# Patient Record
Sex: Female | Born: 1965 | Race: White | Hispanic: No | Marital: Married | Smoking: Never smoker
Health system: Southern US, Community
[De-identification: ages and names within clinical notes are randomized; demographics above are authoritative.]

## PROBLEM LIST (undated history)

## (undated) DIAGNOSIS — K219 Gastro-esophageal reflux disease without esophagitis: Secondary | ICD-10-CM

## (undated) DIAGNOSIS — F419 Anxiety disorder, unspecified: Secondary | ICD-10-CM

## (undated) DIAGNOSIS — R7303 Prediabetes: Secondary | ICD-10-CM

## (undated) DIAGNOSIS — C4491 Basal cell carcinoma of skin, unspecified: Secondary | ICD-10-CM

## (undated) DIAGNOSIS — N870 Mild cervical dysplasia: Secondary | ICD-10-CM

## (undated) HISTORY — DX: Mild cervical dysplasia: N87.0

## (undated) HISTORY — PX: COLONOSCOPY: SHX174

## (undated) HISTORY — PX: CERVICAL BIOPSY  W/ LOOP ELECTRODE EXCISION: SUR135

## (undated) HISTORY — DX: Anxiety disorder, unspecified: F41.9

## (undated) HISTORY — DX: Basal cell carcinoma of skin, unspecified: C44.91

---

## 1995-01-30 HISTORY — PX: DILATION AND CURETTAGE OF UTERUS: SHX78

## 1997-09-07 ENCOUNTER — Other Ambulatory Visit: Admission: RE | Admit: 1997-09-07 | Discharge: 1997-09-07 | Payer: Self-pay | Admitting: Obstetrics and Gynecology

## 1998-09-30 ENCOUNTER — Other Ambulatory Visit: Admission: RE | Admit: 1998-09-30 | Discharge: 1998-09-30 | Payer: Self-pay | Admitting: Obstetrics and Gynecology

## 1999-10-17 ENCOUNTER — Other Ambulatory Visit: Admission: RE | Admit: 1999-10-17 | Discharge: 1999-10-17 | Payer: Self-pay | Admitting: Gynecology

## 2000-02-14 ENCOUNTER — Other Ambulatory Visit: Admission: RE | Admit: 2000-02-14 | Discharge: 2000-02-14 | Payer: Self-pay | Admitting: Gynecology

## 2001-08-22 ENCOUNTER — Other Ambulatory Visit: Admission: RE | Admit: 2001-08-22 | Discharge: 2001-08-22 | Payer: Self-pay | Admitting: Gynecology

## 2001-08-27 ENCOUNTER — Encounter: Admission: RE | Admit: 2001-08-27 | Discharge: 2001-11-25 | Payer: Self-pay | Admitting: Gynecology

## 2002-03-09 ENCOUNTER — Other Ambulatory Visit: Admission: RE | Admit: 2002-03-09 | Discharge: 2002-03-09 | Payer: Self-pay | Admitting: Gynecology

## 2003-11-08 ENCOUNTER — Other Ambulatory Visit: Admission: RE | Admit: 2003-11-08 | Discharge: 2003-11-08 | Payer: Self-pay | Admitting: Gynecology

## 2004-12-26 ENCOUNTER — Other Ambulatory Visit: Admission: RE | Admit: 2004-12-26 | Discharge: 2004-12-26 | Payer: Self-pay | Admitting: Gynecology

## 2005-11-05 ENCOUNTER — Encounter: Admission: RE | Admit: 2005-11-05 | Discharge: 2005-11-05 | Payer: Self-pay | Admitting: Gynecology

## 2005-11-15 ENCOUNTER — Encounter: Admission: RE | Admit: 2005-11-15 | Discharge: 2005-11-15 | Payer: Self-pay | Admitting: Gynecology

## 2006-02-07 ENCOUNTER — Other Ambulatory Visit: Admission: RE | Admit: 2006-02-07 | Discharge: 2006-02-07 | Payer: Self-pay | Admitting: Gynecology

## 2006-11-08 ENCOUNTER — Encounter: Admission: RE | Admit: 2006-11-08 | Discharge: 2006-11-08 | Payer: Self-pay | Admitting: Gynecology

## 2006-11-14 ENCOUNTER — Encounter: Admission: RE | Admit: 2006-11-14 | Discharge: 2006-11-14 | Payer: Self-pay | Admitting: Gynecology

## 2007-03-03 ENCOUNTER — Other Ambulatory Visit: Admission: RE | Admit: 2007-03-03 | Discharge: 2007-03-03 | Payer: Self-pay | Admitting: Gynecology

## 2007-11-21 ENCOUNTER — Encounter: Admission: RE | Admit: 2007-11-21 | Discharge: 2007-11-21 | Payer: Self-pay | Admitting: Gynecology

## 2008-03-08 ENCOUNTER — Other Ambulatory Visit: Admission: RE | Admit: 2008-03-08 | Discharge: 2008-03-08 | Payer: Self-pay | Admitting: Gynecology

## 2008-03-08 ENCOUNTER — Ambulatory Visit: Payer: Self-pay | Admitting: Gynecology

## 2008-03-08 ENCOUNTER — Encounter: Payer: Self-pay | Admitting: Gynecology

## 2008-09-07 ENCOUNTER — Ambulatory Visit: Payer: Self-pay | Admitting: Gynecology

## 2008-11-22 ENCOUNTER — Encounter: Admission: RE | Admit: 2008-11-22 | Discharge: 2008-11-22 | Payer: Self-pay | Admitting: Gynecology

## 2009-04-04 ENCOUNTER — Ambulatory Visit: Payer: Self-pay | Admitting: Gynecology

## 2009-04-04 ENCOUNTER — Other Ambulatory Visit: Admission: RE | Admit: 2009-04-04 | Discharge: 2009-04-04 | Payer: Self-pay | Admitting: Gynecology

## 2009-11-23 ENCOUNTER — Encounter: Admission: RE | Admit: 2009-11-23 | Discharge: 2009-11-23 | Payer: Self-pay | Admitting: Gynecology

## 2010-02-18 ENCOUNTER — Encounter: Payer: Self-pay | Admitting: Gynecology

## 2010-04-11 ENCOUNTER — Encounter (INDEPENDENT_AMBULATORY_CARE_PROVIDER_SITE_OTHER): Payer: 59 | Admitting: Gynecology

## 2010-04-11 ENCOUNTER — Other Ambulatory Visit: Payer: Self-pay | Admitting: Gynecology

## 2010-04-11 ENCOUNTER — Other Ambulatory Visit (HOSPITAL_COMMUNITY)
Admission: RE | Admit: 2010-04-11 | Discharge: 2010-04-11 | Disposition: A | Discharge status: Home / Self Care | Payer: 59 | Source: Ambulatory Visit | Attending: Gynecology | Admitting: Gynecology

## 2010-04-11 DIAGNOSIS — Z124 Encounter for screening for malignant neoplasm of cervix: Secondary | ICD-10-CM | POA: Insufficient documentation

## 2010-04-11 DIAGNOSIS — Z833 Family history of diabetes mellitus: Secondary | ICD-10-CM

## 2010-04-11 DIAGNOSIS — Z01419 Encounter for gynecological examination (general) (routine) without abnormal findings: Secondary | ICD-10-CM

## 2010-04-11 DIAGNOSIS — R635 Abnormal weight gain: Secondary | ICD-10-CM

## 2010-04-11 DIAGNOSIS — Z13220 Encounter for screening for lipoid disorders: Secondary | ICD-10-CM

## 2010-04-11 DIAGNOSIS — N6311 Unspecified lump in the right breast, upper outer quadrant: Secondary | ICD-10-CM

## 2010-04-17 ENCOUNTER — Ambulatory Visit
Admission: RE | Admit: 2010-04-17 | Discharge: 2010-04-17 | Disposition: A | Discharge status: Home / Self Care | Payer: 59 | Source: Ambulatory Visit | Attending: Gynecology | Admitting: Gynecology

## 2010-04-17 DIAGNOSIS — N6311 Unspecified lump in the right breast, upper outer quadrant: Secondary | ICD-10-CM

## 2010-07-04 ENCOUNTER — Ambulatory Visit (INDEPENDENT_AMBULATORY_CARE_PROVIDER_SITE_OTHER): Payer: 59 | Admitting: Gynecology

## 2010-07-04 DIAGNOSIS — N898 Other specified noninflammatory disorders of vagina: Secondary | ICD-10-CM

## 2010-07-04 DIAGNOSIS — L293 Anogenital pruritus, unspecified: Secondary | ICD-10-CM

## 2010-07-04 DIAGNOSIS — R82998 Other abnormal findings in urine: Secondary | ICD-10-CM

## 2010-07-04 DIAGNOSIS — R350 Frequency of micturition: Secondary | ICD-10-CM

## 2010-07-04 DIAGNOSIS — B373 Candidiasis of vulva and vagina: Secondary | ICD-10-CM

## 2010-10-09 ENCOUNTER — Other Ambulatory Visit: Payer: Self-pay | Admitting: Gynecology

## 2010-10-09 DIAGNOSIS — Z1231 Encounter for screening mammogram for malignant neoplasm of breast: Secondary | ICD-10-CM

## 2010-10-12 ENCOUNTER — Encounter: Payer: Self-pay | Admitting: Gynecology

## 2010-10-12 ENCOUNTER — Ambulatory Visit (INDEPENDENT_AMBULATORY_CARE_PROVIDER_SITE_OTHER): Payer: 59 | Admitting: Gynecology

## 2010-10-12 VITALS — BP 108/68

## 2010-10-12 DIAGNOSIS — O009 Unspecified ectopic pregnancy without intrauterine pregnancy: Secondary | ICD-10-CM | POA: Insufficient documentation

## 2010-10-12 DIAGNOSIS — Z23 Encounter for immunization: Secondary | ICD-10-CM

## 2010-10-12 DIAGNOSIS — R82998 Other abnormal findings in urine: Secondary | ICD-10-CM

## 2010-10-12 DIAGNOSIS — R300 Dysuria: Secondary | ICD-10-CM

## 2010-10-12 DIAGNOSIS — N870 Mild cervical dysplasia: Secondary | ICD-10-CM | POA: Insufficient documentation

## 2010-10-12 DIAGNOSIS — B373 Candidiasis of vulva and vagina: Secondary | ICD-10-CM

## 2010-10-12 DIAGNOSIS — Z9852 Vasectomy status: Secondary | ICD-10-CM | POA: Insufficient documentation

## 2010-10-12 DIAGNOSIS — N390 Urinary tract infection, site not specified: Secondary | ICD-10-CM

## 2010-10-12 MED ORDER — FLUCONAZOLE 150 MG PO TABS: 150.0000 mg | ORAL_TABLET | Freq: Once | ORAL | Status: AC

## 2010-10-12 MED ORDER — NITROFURANTOIN MONOHYD MACRO 100 MG PO CAPS: 100.0000 mg | ORAL_CAPSULE | Freq: Two times a day (BID) | ORAL | Status: AC

## 2010-10-12 NOTE — Progress Notes (Signed)
Patient presented to the office today complaining of 2-3 day history of dysuria and frequency. She denies any back pain, nausea, vomiting, chills or vaginal discharge. Patient is separated has not been sexually active. Her last menstrual period was 10/05/2010. Review of her record and indicated in June she was treated for urinary tract infection with Cipro. Patient has seen Dr. Wilson Singer urologist several years ago for similar complaint. Patient hasn't had a urinary tract infection before the or the graft recommended she followup with him.  Examination: Back: No CVA tenderness Abdomen: Soft nontender no rebound or guarding Pelvic: Bartholin urethra Skene was within normal limits Vagina: No gross lesions on inspection Cervix: No lesions or discharge Bimanual examination: Uterus anteverted normal size shape and consistency adnexa no masses or tenderness Rectal: Not done  Urinalysis demonstrated 2+ bacteria with 5-10 WBC and 2-3 RBC.  Assessment/plan based on patient's symptomatology and urinalysis we'll proceed with treating her with Macrobid 100 mg one tablet twice a day for 7 days have given her prescription also for Uribel as an anti-spasmodic agent to take 1 tablet 4 times a day for 2 days and to increase her fluid intake. Because of her sensitivity to antibiotics she will be given a prescription of Diflucan 150 mg to take one by mouth in the event of an yeast infection.Flu vaccine administered today after consent form signed.

## 2010-10-26 ENCOUNTER — Other Ambulatory Visit: Payer: Self-pay | Admitting: Dermatology

## 2010-11-27 ENCOUNTER — Ambulatory Visit
Admission: RE | Admit: 2010-11-27 | Discharge: 2010-11-27 | Disposition: A | Discharge status: Home / Self Care | Payer: 59 | Source: Ambulatory Visit | Attending: Gynecology | Admitting: Gynecology

## 2010-11-27 DIAGNOSIS — Z1231 Encounter for screening mammogram for malignant neoplasm of breast: Secondary | ICD-10-CM

## 2010-12-11 ENCOUNTER — Other Ambulatory Visit: Payer: Self-pay | Admitting: *Deleted

## 2010-12-11 MED ORDER — ALPRAZOLAM 0.25 MG PO TABS: 0.2500 mg | ORAL_TABLET | Freq: Every evening | ORAL | Status: DC | PRN

## 2010-12-12 NOTE — Telephone Encounter (Signed)
Called in rx to pharmacy.

## 2011-01-25 ENCOUNTER — Encounter: Payer: Self-pay | Admitting: Women's Health

## 2011-01-25 ENCOUNTER — Ambulatory Visit (INDEPENDENT_AMBULATORY_CARE_PROVIDER_SITE_OTHER): Payer: 59 | Admitting: Women's Health

## 2011-01-25 DIAGNOSIS — R82998 Other abnormal findings in urine: Secondary | ICD-10-CM

## 2011-01-25 DIAGNOSIS — R109 Unspecified abdominal pain: Secondary | ICD-10-CM

## 2011-01-25 DIAGNOSIS — B373 Candidiasis of vulva and vagina: Secondary | ICD-10-CM

## 2011-01-25 MED ORDER — FLUCONAZOLE 150 MG PO TABS: 150.0000 mg | ORAL_TABLET | Freq: Once | ORAL | Status: AC

## 2011-01-25 NOTE — Progress Notes (Signed)
Patient ID: Amanda Zuniga, female   DOB: 1965/07/30, 45 y.o.   MRN: [id number] Presents with a complaint of low abdominal bladder pressure and increased urinary frequency. History of recurrent UTIs especially associated with intercourse. Has not been sexually active in greater than 6 months.Increase in bladder pressure and pressure in the lower left quadrant. Denies any changes in bowel elimination or vaginal discharge. Monthly cycle without change. Used Macrobid 2 months ago with good relief of symptoms. Significant history mother and maternal grandmother with bladder cancer.  Exam: Appears well. UA 0-2 WBCs, +1 bacteria, 0-2 RBCs. No CVAT abdomen was soft with no rebound or radiation of pain. External genitalia normal limits, speculum exam scant white discharge, wet prep positive for yeast. Bimanual no CMT slight discomfort in left lower quadrant, no noted fullness.  Plan: Culture urine. Diflucan 150 by mouth x1 dose with refill. Schedule ultrasound to rule out ovarian problem. Reviewed followup with urologist due to strong family history of bladder cancer.

## 2011-01-29 ENCOUNTER — Encounter: Payer: Self-pay | Admitting: *Deleted

## 2011-01-29 NOTE — Progress Notes (Signed)
Patient ID: Amanda Zuniga, female   DOB: [birth year], 45 y.o.   MRN: [id number] Pharmacy called wanting to make that pt xanax 0.25 mg is correct,when called in. Per epic [facility] approved medication for pt. Pt was on xanax 0.5 mg. Spoke with Revonda Standard pharmacist at [facility] regarding this.

## 2011-02-02 ENCOUNTER — Ambulatory Visit: Payer: 59 | Admitting: Women's Health

## 2011-02-02 ENCOUNTER — Other Ambulatory Visit: Payer: 59

## 2011-02-05 ENCOUNTER — Ambulatory Visit (INDEPENDENT_AMBULATORY_CARE_PROVIDER_SITE_OTHER): Payer: 59

## 2011-02-05 ENCOUNTER — Ambulatory Visit (INDEPENDENT_AMBULATORY_CARE_PROVIDER_SITE_OTHER): Payer: 59 | Admitting: Women's Health

## 2011-02-05 ENCOUNTER — Other Ambulatory Visit: Payer: Self-pay | Admitting: Women's Health

## 2011-02-05 DIAGNOSIS — R350 Frequency of micturition: Secondary | ICD-10-CM

## 2011-02-05 DIAGNOSIS — N8300 Follicular cyst of ovary, unspecified side: Secondary | ICD-10-CM

## 2011-02-05 DIAGNOSIS — R109 Unspecified abdominal pain: Secondary | ICD-10-CM

## 2011-02-05 DIAGNOSIS — N949 Unspecified condition associated with female genital organs and menstrual cycle: Secondary | ICD-10-CM

## 2011-02-05 DIAGNOSIS — N800 Endometriosis of uterus: Secondary | ICD-10-CM

## 2011-02-05 LAB — URINALYSIS, ROUTINE W REFLEX MICROSCOPIC
Leukocytes, UA: NEGATIVE
Nitrite: NEGATIVE
Specific Gravity, Urine: 1.015 (ref 1.005–1.030)
Urobilinogen, UA: 0.2 mg/dL (ref 0.0–1.0)
pH: 5.5 (ref 5.0–8.0)

## 2011-02-05 LAB — URINALYSIS, MICROSCOPIC ONLY: Casts: NONE SEEN

## 2011-02-05 MED ORDER — NITROFURANTOIN MONOHYD MACRO 100 MG PO CAPS: 100.0000 mg | ORAL_CAPSULE | Freq: Two times a day (BID) | ORAL | Status: DC

## 2011-02-05 NOTE — Patient Instructions (Signed)
Adenomyosis/cortical cystic areas in endometrium

## 2011-02-05 NOTE — Progress Notes (Signed)
Patient ID: Amanda Zuniga, female   DOB: [birth year], 46 y.o.   MRN: [id number] Presents for a followup ultrasound from office visit on 01/25/11.Complaint of bladder pressure, frequency, and urgency with a negative UA and culture. Continues with a bladder pressure, dull ache, denies pain, or burning. Denies a fever.  Ultrasound: Anteverted uterus a tiny cortical cystic areas on the wall the endometrium. Normal uterus and ovaries. Follicle on the right ovary 19 x 17 mm left ovary a follicle 13 mm negative cul-de-sac.  Bladder pressure/adenomyosis  Plan: Recheck urine-trace hemaglobin.  Macrobid 100 prophylactically with intercourse, followup with urologist due to strong family history of bladder cancer/mother and grandmother and trace blood in urine.

## 2011-04-20 ENCOUNTER — Ambulatory Visit (INDEPENDENT_AMBULATORY_CARE_PROVIDER_SITE_OTHER): Payer: 59 | Admitting: Gynecology

## 2011-04-20 ENCOUNTER — Encounter: Payer: Self-pay | Admitting: Gynecology

## 2011-04-20 ENCOUNTER — Other Ambulatory Visit (HOSPITAL_COMMUNITY)
Admission: RE | Admit: 2011-04-20 | Discharge: 2011-04-20 | Disposition: A | Discharge status: Home / Self Care | Payer: 59 | Source: Ambulatory Visit | Attending: Gynecology | Admitting: Gynecology

## 2011-04-20 VITALS — BP 110/70 | Ht 65.25 in | Wt 137.0 lb

## 2011-04-20 DIAGNOSIS — Z01419 Encounter for gynecological examination (general) (routine) without abnormal findings: Secondary | ICD-10-CM

## 2011-04-20 DIAGNOSIS — O99810 Abnormal glucose complicating pregnancy: Secondary | ICD-10-CM

## 2011-04-20 DIAGNOSIS — O24419 Gestational diabetes mellitus in pregnancy, unspecified control: Secondary | ICD-10-CM

## 2011-04-20 MED ORDER — ALPRAZOLAM 0.25 MG PO TABS: 0.2500 mg | ORAL_TABLET | Freq: Every evening | ORAL | Status: DC | PRN

## 2011-04-20 MED ORDER — NITROFURANTOIN MONOHYD MACRO 100 MG PO CAPS: 100.0000 mg | ORAL_CAPSULE | Freq: Two times a day (BID) | ORAL | Status: DC

## 2011-04-20 MED ORDER — CLINDAMYCIN PHOSPHATE 2 % VA CREA: 1.0000 | TOPICAL_CREAM | Freq: Every day | VAGINAL | Status: AC

## 2011-04-20 NOTE — Progress Notes (Signed)
Amanda Zuniga Jan 16, 1966 [id number]   History:    46 y.o.  for annual exam gravida 4 para 2 AB 1 with known history of gestational diabetes. Patient's husband has had a vasectomy. Patient's having normal menstrual cycles. BMI 22.6 same as last year. Last mammogram October 2012 normal. Patient frequently does self breast exam. Last normal Pap smear March 2012. History of CIN-1 in 2006. Patient with history of basal cell carcinoma of the back, chest and right leg has been followed by Dr. Amy Swaziland (dermatologist) every 6 months.  Past medical history,surgical history, family history and social history were all reviewed and documented in the EPIC chart.  Gynecologic History Patient's last menstrual period was 04/04/2011. Contraception: vasectomy Last Pap: 2012. Results were: normal Last mammogram: 2012. Results were: normal  Obstetric History OB History    Grav Para Term Preterm Abortions TAB SAB Ect Mult Living   4 2 1 1 2  1 1  2      # Outc Date GA Lbr Len/2nd Wgt Sex Del Anes PTL Lv   1 TRM     M SVD  No Yes   2 PRE     M CS  Yes Yes   3 SAB            4 ECT                ROS:  Was performed and pertinent positives and negatives are included in the history.  Exam: chaperone present  BP 110/70  Ht 5' 5.25" (1.657 m)  Wt 137 lb (62.143 kg)  BMI 22.62 kg/m2  LMP 04/04/2011  Body mass index is 22.62 kg/(m^2).  General appearance : Well developed well nourished female. No acute distress HEENT: Neck supple, trachea midline, no carotid bruits, no thyroidmegaly Lungs: Clear to auscultation, no rhonchi or wheezes, or rib retractions  Heart: Regular rate and rhythm, no murmurs or gallops Breast:Examined in sitting and supine position were symmetrical in appearance, no palpable masses or tenderness,  no skin retraction, no nipple inversion, no nipple discharge, no skin discoloration, no axillary or supraclavicular lymphadenopathy Abdomen: no palpable masses or tenderness, no  rebound or guarding Extremities: no edema or skin discoloration or tenderness  Pelvic:  Bartholin, Urethra, Skene Glands: Within normal limits             Vagina: No gross lesions or discharge, right vaginal fornix leukoplakic areas?  Cervix: No gross lesions or discharge  Uterus  anteverted, normal size, shape and consistency, non-tender and mobile  Adnexa  Without masses or tenderness  Anus and perineum  normal   Rectovaginal  normal sphincter tone without palpated masses or tenderness             Hemoccult not done     Assessment/Plan:  46 y.o. female for annual exam will be asked to return back next week for colposcopy do to right fornix leukoplakic areas. Hyperkeratosis? Pap smear was done today as well as a random blood sugar, cholesterol, CBC, and urinalysis. Mammogram later this year.    Ok Edwards MD, 10:47 AM 04/20/2011

## 2011-04-20 NOTE — Patient Instructions (Signed)
Will see you next week for colposcopy. Use the vaginal cream for 5 nights starting tonight

## 2011-04-21 LAB — URINALYSIS W MICROSCOPIC + REFLEX CULTURE
Bilirubin Urine: NEGATIVE
Glucose, UA: NEGATIVE mg/dL
Protein, ur: NEGATIVE mg/dL
Urobilinogen, UA: 0.2 mg/dL (ref 0.0–1.0)

## 2011-04-21 LAB — CBC WITH DIFFERENTIAL/PLATELET
Basophils Absolute: 0 10*3/uL (ref 0.0–0.1)
Basophils Relative: 0 % (ref 0–1)
Eosinophils Absolute: 0.1 10*3/uL (ref 0.0–0.7)
Hemoglobin: 13.3 g/dL (ref 12.0–15.0)
MCH: 30 pg (ref 26.0–34.0)
MCHC: 31.7 g/dL (ref 30.0–36.0)
Monocytes Absolute: 0.6 10*3/uL (ref 0.1–1.0)
Monocytes Relative: 9 % (ref 3–12)
Neutro Abs: 4.1 10*3/uL (ref 1.7–7.7)
Neutrophils Relative %: 64 % (ref 43–77)
RDW: 12.8 % (ref 11.5–15.5)

## 2011-04-21 LAB — TSH: TSH: 1.667 u[IU]/mL (ref 0.350–4.500)

## 2011-04-21 LAB — GLUCOSE, RANDOM: Glucose, Bld: 78 mg/dL (ref 70–99)

## 2011-04-25 ENCOUNTER — Encounter: Payer: Self-pay | Admitting: Gynecology

## 2011-04-25 ENCOUNTER — Ambulatory Visit (INDEPENDENT_AMBULATORY_CARE_PROVIDER_SITE_OTHER): Payer: 59 | Admitting: Gynecology

## 2011-04-25 VITALS — BP 100/66

## 2011-04-25 DIAGNOSIS — N898 Other specified noninflammatory disorders of vagina: Secondary | ICD-10-CM

## 2011-04-25 NOTE — Progress Notes (Signed)
patient presented to the office for colposcopic evaluation of her cervix and vagina as a result of an incidental finding during her annual gynecological examination on March 22 whereby a leukoplakic-like lesions were noted around the vaginal fornix (right). Patient with past history of CIN-1 in 2006 with normal Pap smear subsequently. Most recent Pap smear March 22 was normal of this year.  Patient underwent a detail colposcopic evaluation the external genitalia perineal body and perirectal region were thoroughly inspected no lesions were noted. The speculum was introduced into the vagina and a systematic inspection of the vagina right and left anterior and posterior fornices were evaluated with no lesions seen. Endocervical speculum was utilized transformation zone was visualized and no lesions noted. Of note acetic acid applied and no lesions were seen.  The area seen in the right fornix at time of her annual exam with shortly after her last menstrual period and perhaps attributed irritation from tampon usage. Patient was reassured she'll return back in one year for her annual exam or when necessary.

## 2011-10-04 ENCOUNTER — Other Ambulatory Visit: Payer: Self-pay | Admitting: *Deleted

## 2011-10-04 MED ORDER — ALPRAZOLAM 0.25 MG PO TABS: 0.2500 mg | ORAL_TABLET | Freq: Every evening | ORAL | Status: DC | PRN

## 2011-10-12 ENCOUNTER — Other Ambulatory Visit: Payer: Self-pay | Admitting: Dermatology

## 2011-10-25 ENCOUNTER — Other Ambulatory Visit: Payer: Self-pay | Admitting: Gynecology

## 2011-10-25 DIAGNOSIS — Z1231 Encounter for screening mammogram for malignant neoplasm of breast: Secondary | ICD-10-CM

## 2011-11-30 ENCOUNTER — Ambulatory Visit
Admission: RE | Admit: 2011-11-30 | Discharge: 2011-11-30 | Disposition: A | Discharge status: Home / Self Care | Payer: 59 | Source: Ambulatory Visit | Attending: Gynecology | Admitting: Gynecology

## 2011-11-30 DIAGNOSIS — Z1231 Encounter for screening mammogram for malignant neoplasm of breast: Secondary | ICD-10-CM

## 2012-01-31 ENCOUNTER — Other Ambulatory Visit: Payer: Self-pay | Admitting: Women's Health

## 2012-01-31 NOTE — Telephone Encounter (Signed)
Dr. Glenetta Hew prescribed this at 04/20/2011 [facility] but for #14 with refills.  The request came in under your name and for #30.

## 2012-01-31 NOTE — Telephone Encounter (Signed)
Telephone call to clarify request. States takes only as needed  postcoital no symptoms currently out of Rx.

## 2012-03-14 ENCOUNTER — Other Ambulatory Visit: Payer: Self-pay | Admitting: Gynecology

## 2012-04-11 ENCOUNTER — Other Ambulatory Visit: Payer: Self-pay | Admitting: Dermatology

## 2012-04-21 ENCOUNTER — Ambulatory Visit (INDEPENDENT_AMBULATORY_CARE_PROVIDER_SITE_OTHER): Payer: 59 | Admitting: Gynecology

## 2012-04-21 ENCOUNTER — Encounter: Payer: Self-pay | Admitting: Gynecology

## 2012-04-21 VITALS — BP 112/68 | Ht 65.0 in | Wt 142.5 lb

## 2012-04-21 DIAGNOSIS — Z8632 Personal history of gestational diabetes: Secondary | ICD-10-CM

## 2012-04-21 DIAGNOSIS — Z01419 Encounter for gynecological examination (general) (routine) without abnormal findings: Secondary | ICD-10-CM

## 2012-04-21 DIAGNOSIS — Z23 Encounter for immunization: Secondary | ICD-10-CM

## 2012-04-21 LAB — CBC WITH DIFFERENTIAL/PLATELET
Basophils Absolute: 0 10*3/uL (ref 0.0–0.1)
Eosinophils Relative: 0 % (ref 0–5)
HCT: 39.9 % (ref 36.0–46.0)
Hemoglobin: 13.4 g/dL (ref 12.0–15.0)
Lymphocytes Relative: 25 % (ref 12–46)
Lymphs Abs: 2.2 10*3/uL (ref 0.7–4.0)
MCV: 88.1 fL (ref 78.0–100.0)
Monocytes Absolute: 0.4 10*3/uL (ref 0.1–1.0)
Monocytes Relative: 5 % (ref 3–12)
Neutro Abs: 6.2 10*3/uL (ref 1.7–7.7)
RDW: 12.7 % (ref 11.5–15.5)
WBC: 8.9 10*3/uL (ref 4.0–10.5)

## 2012-04-21 LAB — LIPID PANEL
Cholesterol: 203 mg/dL — ABNORMAL HIGH (ref 0–200)
HDL: 62 mg/dL (ref >39)
Total CHOL/HDL Ratio: 3.3 Ratio

## 2012-04-21 LAB — GLUCOSE, RANDOM: Glucose, Bld: 92 mg/dL (ref 70–99)

## 2012-04-21 MED ORDER — ALPRAZOLAM 0.25 MG PO TABS: 0.2500 mg | ORAL_TABLET | Freq: Every evening | ORAL | Status: DC | PRN

## 2012-04-21 NOTE — Patient Instructions (Addendum)

## 2012-04-21 NOTE — Progress Notes (Signed)
Amanda Zuniga 07/30/1965 [id number]   History:    47 y.o.  for annual gyn exam with no complaints today. She doesn't take Xanax on a when necessary basis for anxiety. Her menstrual cycles are reported to be normal. Her husband has had a vasectomy. Patient is exercising regularly. Her BMI today was 23.71.  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 Followup Pap smears have been normal.  Patient does have history of basal cell carcinoma of her back, chest wall, and right legAnd is being followed by her dermatologist Dr. Amy Swaziland twice a year. Past medical history,surgical history, family history and social history were all reviewed and documented in the EPIC chart.  Gynecologic History Patient's last menstrual period was 03/24/2012. Contraception: vasectomy Last Pap: 2013. Results were: normal Last mammogram: November 2013. Results were: normal  Obstetric History OB History   Grav Para Term Preterm Abortions TAB SAB Ect Mult Living   4 2 1 1 2  1 1  2      # Outc Date GA Lbr Len/2nd Wgt Sex Del Anes PTL Lv   1 TRM     M SVD  No Yes   2 PRE     M CS  Yes Yes   3 SAB            4 ECT                ROS: A ROS was performed and pertinent positives and negatives are included in the history.  GENERAL: No fevers or chills. HEENT: No change in vision, no earache, sore throat or sinus congestion. NECK: No pain or stiffness. CARDIOVASCULAR: No chest pain or pressure. No palpitations. PULMONARY: No shortness of breath, cough or wheeze. GASTROINTESTINAL: No abdominal pain, nausea, vomiting or diarrhea, melena or bright red blood per rectum. GENITOURINARY: No urinary frequency, urgency, hesitancy or dysuria. MUSCULOSKELETAL: No joint or muscle pain, no back pain, no recent trauma. DERMATOLOGIC: No rash, no itching, no lesions. ENDOCRINE: No polyuria, polydipsia, no heat or cold intolerance. No recent change in weight. HEMATOLOGICAL: No anemia  or easy bruising or bleeding. NEUROLOGIC: No headache, seizures, numbness, tingling or weakness. PSYCHIATRIC: No depression, no loss of interest in normal activity or change in sleep pattern.     Exam: chaperone present  BP 112/68  Ht 5\' 5"  (1.651 m)  Wt 142 lb 8 oz (64.638 kg)  BMI 23.71 kg/m2  LMP 03/24/2012  Body mass index is 23.71 kg/(m^2).  General appearance : Well developed well nourished female. No acute distress HEENT: Neck supple, trachea midline, no carotid bruits, no thyroidmegaly Lungs: Clear to auscultation, no rhonchi or wheezes, or rib retractions  Heart: Regular rate and rhythm, no murmurs or gallops Breast:Examined in sitting and supine position were symmetrical in appearance, no palpable masses or tenderness,  no skin retraction, no nipple inversion, no nipple discharge, no skin discoloration, no axillary or supraclavicular lymphadenopathy Abdomen: no palpable masses or tenderness, no rebound or guarding Extremities: no edema or skin discoloration or tenderness  Pelvic:  Bartholin, Urethra, Skene Glands: Within normal limits             Vagina: No gross lesions or discharge  Cervix: No gross lesions or discharge  Uterus  anteverted, normal size, shape and consistency, non-tender and mobile  Adnexa  Without masses or tenderness  Anus and perineum  normal   Rectovaginal  normal sphincter tone without palpated masses or tenderness  Hemoccult not indicated     Assessment/Plan:  47 y.o. female for annual exam and we'll have the following labs drawn today: Because of her history in the past of gestational diabetes we will do a fasting blood sugar today along with her fasting lipid profile, TSH, CBC and urinalysis. No Pap smear done today the new guidelines were discussed. She was reminded that on the importance of self breast examination as well as continue her exercise program and her calcium and vitamin D intake. Prescription refill for Xanax for anxiety  was provided. She received a Tdap vaccine and literature information was provided.    Ok Edwards MD, 10:50 AM 04/21/2012

## 2012-04-21 NOTE — Addendum Note (Signed)
Addended by: Bertram Savin A on: 04/21/2012 10:59 AM   Modules accepted: Orders

## 2012-04-21 NOTE — Addendum Note (Signed)
Addended by: Ok Edwards on: 04/21/2012 11:50 AM   Modules accepted: Orders

## 2012-04-22 ENCOUNTER — Telehealth: Payer: Self-pay | Admitting: *Deleted

## 2012-04-22 LAB — URINALYSIS W MICROSCOPIC + REFLEX CULTURE
Bilirubin Urine: NEGATIVE
Casts: NONE SEEN
Glucose, UA: NEGATIVE mg/dL
Hgb urine dipstick: NEGATIVE
Leukocytes, UA: NEGATIVE
Protein, ur: NEGATIVE mg/dL
Squamous Epithelial / LPF: NONE SEEN
pH: 6 (ref 5.0–8.0)

## 2012-04-22 NOTE — Telephone Encounter (Signed)
error 

## 2012-04-22 NOTE — Telephone Encounter (Signed)
Pt called stating her xanax 0.25 mg tablets were not at her pharmacy from [facility] yesterday due to set on phone in. x[facility]ax 0.25 [facility] informed.

## 2012-05-02 ENCOUNTER — Encounter: Payer: Self-pay | Admitting: Gynecology

## 2012-08-21 ENCOUNTER — Other Ambulatory Visit: Payer: Self-pay | Admitting: Gynecology

## 2012-08-22 NOTE — Telephone Encounter (Signed)
Called into pharmacy

## 2012-09-09 ENCOUNTER — Telehealth: Payer: Self-pay | Admitting: *Deleted

## 2012-09-09 ENCOUNTER — Ambulatory Visit: Payer: 59 | Admitting: Gynecology

## 2012-09-09 ENCOUNTER — Encounter: Payer: Self-pay | Admitting: Gynecology

## 2012-09-09 ENCOUNTER — Ambulatory Visit (INDEPENDENT_AMBULATORY_CARE_PROVIDER_SITE_OTHER): Payer: 59 | Admitting: Gynecology

## 2012-09-09 VITALS — BP 114/70

## 2012-09-09 DIAGNOSIS — N951 Menopausal and female climacteric states: Secondary | ICD-10-CM | POA: Insufficient documentation

## 2012-09-09 DIAGNOSIS — N949 Unspecified condition associated with female genital organs and menstrual cycle: Secondary | ICD-10-CM

## 2012-09-09 DIAGNOSIS — N938 Other specified abnormal uterine and vaginal bleeding: Secondary | ICD-10-CM | POA: Insufficient documentation

## 2012-09-09 LAB — CBC WITH DIFFERENTIAL/PLATELET
Basophils Relative: 0 % (ref 0–1)
Eosinophils Absolute: 0.1 10*3/uL (ref 0.0–0.7)
Eosinophils Relative: 1 % (ref 0–5)
Hemoglobin: 13.8 g/dL (ref 12.0–15.0)
Lymphs Abs: 2.1 10*3/uL (ref 0.7–4.0)
MCH: 30 pg (ref 26.0–34.0)
MCHC: 33.7 g/dL (ref 30.0–36.0)
MCV: 89.1 fL (ref 78.0–100.0)
Monocytes Relative: 8 % (ref 3–12)
RBC: 4.6 MIL/uL (ref 3.87–5.11)

## 2012-09-09 NOTE — Progress Notes (Signed)
Patient is a 47 year old who presented to the office today because of dysfunctional uterine bleeding and vasomotor like symptoms. Her husband has had a vasectomy. Patient states that for the past 6 months she's had 2 periods per month lasting 5-7 days. She has episodes of hot flashes irritability at times mood swings but no true vaginal dryness or node decreased libido. She stated her mother reached menopause at the age of 42. Patient denied any nipple discharge, unusual headaches or visual disturbances.  Exam: Abdomen: Soft nontender no rebound guarding pelvic: And urethra Skene was within normal limits Vagina: No lesions or discharge Cervix: No lesions or discharge Uterus: Anteverted normal size shape and consistency Adnexa: No palpable mass or tenderness Rectal exam not done  Assessment/plan: A 47 year old patient with peri-menopausal-like symptoms and dysfunctional uterine bleeding. Her cervix was cleansed with Betadine solution and a Pipelle was introduced into the intrauterine cavity whereby an endometrial biopsy was obtained and submitted for histological evaluation. Patient will be scheduled for a sonohysterogram to rule out any intracavitary polyps or myomas contributing to her irregular bleeding. Following labs were ordered: CBC, FSH, TSH and prolactin. Patient had a normal Pap smear March 2013.

## 2012-09-09 NOTE — Telephone Encounter (Signed)
Pt had endometrial biopsy done today called back stating he has some bleeding asking if normal and if okay to were tampon? I left message on her voicemail okay to wear tampon and yes normal to have bleeding after bx.

## 2012-09-09 NOTE — Patient Instructions (Addendum)
Endometrial Biopsy This is a test in which a tissue sample (a biopsy) is taken from inside the uterus (womb). It is then looked at by a specialist under a microscope to see if the tissue is normal or abnormal. The endometrium is the lining of the uterus. This test helps determine where you are in your menstrual cycle and how hormone levels are affecting the lining of the uterus. Another use for this test is to diagnose endometrial cancer, tuberculosis, polyps, or inflammatory conditions and to evaluate uterine bleeding. PREPARATION FOR TEST No preparation or fasting is necessary. NORMAL FINDINGS No pathologic conditions. Presence of "secretory-type" endometrium 3 to 5 days before to normal menstruation. Ranges for normal findings may vary among different laboratories and hospitals. You should always check with your doctor after having lab work or other tests done to discuss the meaning of your test results and whether your values are considered within normal limits. MEANING OF TEST  Your caregiver will go over the test results with you and discuss the importance and meaning of your results, as well as treatment options and the need for additional tests if necessary. OBTAINING THE TEST RESULTS It is your responsibility to obtain your test results. Ask the lab or department performing the test when and how you will get your results. Document Released: 05/18/2004 Document Revised: 04/09/2011 Document Reviewed: 12/26/2007 [facility] Patient Information 2014 [place], [place]. Transvaginal Ultrasound Transvaginal ultrasound is a pelvic ultrasound, using a metal probe that is placed in the vagina, to look at a women's female organs. Transvaginal ultrasound is a method of seeing inside the pelvis of a woman. The ultrasound machine sends out sound waves from the transducer (probe). These sound waves bounce off body structures (like an echo) to create a picture. The picture shows up on a monitor. It is called  transvaginal because the probe is inserted into the vagina. There should be very little discomfort from the vaginal probe. This test can also be used during pregnancy. Endovaginal ultrasound is another name for a transvaginal ultrasound. In a transabdominal ultrasound, the probe is placed on the outside of the belly. This method gives pictures that are lower quality than pictures from the transvaginal technique. Transvaginal ultrasound is used to look for problems of the female genital tract. Some such problems include:  Infertility problems.  Congenital (birth defect) malformations of the uterus and ovaries.  Tumors in the uterus.  Abnormal bleeding.  Ovarian tumors and cysts.  Abscess (inflamed tissue around pus) in the pelvis.  Unexplained abdominal or pelvic pain.  Pelvic infection. DURING PREGNANCY, TRANSVAGINAL ULTRASOUND MAY BE USED TO LOOK AT:  Normal pregnancy.  Ectopic pregnancy (pregnancy outside the uterus).  Fetal heartbeat.  Abnormalities in the pelvis, that are not seen well with transabdominal ultrasound.  Suspected twins or multiples.  Impending miscarriage.  Problems with the cervix (incompetent cervix, not able to stay closed and hold the baby).  When doing an amniocentesis (removing fluid from the pregnancy sac, for testing).  Looking for abnormalities of the baby.  Checking the growth, development, and age of the fetus.  Measuring the amount of fluid in the amniotic sac.  When doing an external version of the baby (moving baby into correct position).  Evaluating the baby for problems in high risk pregnancies (biophysical profile).  Suspected fetal demise (death). Sometimes a special ultrasound method called Saline Infusion Sonography (SIS) is used for a more accurate look at the uterus. Sterile saline (salt water) is injected into the uterus of non-pregnant  patients to see the inside of the uterus better. SIS is not used on pregnant women. The  vaginal probe can also assist in obtaining biopsies of abnormal areas, in draining fluid from cysts on the ovary, and in finding IUDs (intrauterine device, birth control) that cannot be located. PREPARATION FOR TEST A transvaginal ultrasound is done with the bladder empty. The transabdominal ultrasound is done with your bladder full. You may be asked to drink several glasses of water before that exam. Sometimes, a transabdominal ultrasound is done just after a transvaginal ultrasound, to look at organs in your abdomen. PROCEDURE  You will lie down on a table, with your knees bent and your feet in foot holders. The probe is covered with a condom. A sterile lubricant is put into the vagina and on the probe. The lubricant helps transmit the sound waves and avoid irritating the vagina. Your caregiver will move the probe inside the vaginal cavity to scan the pelvic structures. A normal test will show a normal pelvis and normal contents. An abnormal test will show abnormalities of the pelvis, placenta, or baby. ABNORMAL RESULTS MAY BE DUE TO:  Growths or tumors in the:  Uterus.  Ovaries.  Vagina.  Other pelvic structures.  Non-cancerous growths of the uterus and ovaries.  Twisting of the ovary, cutting off blood supply to the ovary (ovarian torsion).  Areas of infection, including:  Pelvic inflammatory disease.  Abscess in the pelvis.  Locating an IUD. PROBLEMS FOUND IN PREGNANT WOMEN MAY INCLUDE:  Ectopic pregnancy (pregnancy outside the uterus).  Multiple pregnancies.  Early dilation (opening) of the cervix. This may indicate an incompetent cervix and early delivery.  Impending miscarriage.  Fetal death.  Problems with the placenta, including:  Placenta has grown over the opening of the womb (placenta previa).  Placenta has separated early in the womb (placental abruption).  Placenta grows into the muscle of the uterus (placenta accreta).  Tumors of pregnancy, including  gestational trophoblastic disease. This is an abnormal pregnancy, with no fetus. The uterus is filled with many grape-like cysts that could sometimes be cancerous.  Incorrect position of the fetus (breech, vertex).  Intrauterine fetal growth retardation (IUGR) (poor growth in the womb).  Fetal abnormalities or infection. RISKS AND COMPLICATIONS There are no known risks to the ultrasound procedure. There is no X-ray used when doing an ultrasound. Document Released: 12/28/2003 Document Revised: 04/09/2011 Document Reviewed: 12/15/2008 [facility] Patient Information 2014 [place], [place].   Labs ordered: CBC, FSH, TSH, Prolactin

## 2012-09-10 LAB — FOLLICLE STIMULATING HORMONE: FSH: 19.6 m[IU]/mL

## 2012-09-17 ENCOUNTER — Other Ambulatory Visit: Payer: Self-pay | Admitting: Gynecology

## 2012-09-17 DIAGNOSIS — N938 Other specified abnormal uterine and vaginal bleeding: Secondary | ICD-10-CM

## 2012-09-24 ENCOUNTER — Ambulatory Visit (INDEPENDENT_AMBULATORY_CARE_PROVIDER_SITE_OTHER): Payer: 59

## 2012-09-24 ENCOUNTER — Ambulatory Visit (INDEPENDENT_AMBULATORY_CARE_PROVIDER_SITE_OTHER): Payer: 59 | Admitting: Gynecology

## 2012-09-24 DIAGNOSIS — R102 Pelvic and perineal pain: Secondary | ICD-10-CM

## 2012-09-24 DIAGNOSIS — N839 Noninflammatory disorder of ovary, fallopian tube and broad ligament, unspecified: Secondary | ICD-10-CM

## 2012-09-24 DIAGNOSIS — N8000 Endometriosis of the uterus, unspecified: Secondary | ICD-10-CM

## 2012-09-24 DIAGNOSIS — N938 Other specified abnormal uterine and vaginal bleeding: Secondary | ICD-10-CM

## 2012-09-24 DIAGNOSIS — N949 Unspecified condition associated with female genital organs and menstrual cycle: Secondary | ICD-10-CM

## 2012-09-24 DIAGNOSIS — N83201 Unspecified ovarian cyst, right side: Secondary | ICD-10-CM | POA: Insufficient documentation

## 2012-09-24 DIAGNOSIS — N921 Excessive and frequent menstruation with irregular cycle: Secondary | ICD-10-CM

## 2012-09-24 DIAGNOSIS — N83209 Unspecified ovarian cyst, unspecified side: Secondary | ICD-10-CM

## 2012-09-24 MED ORDER — MEDROXYPROGESTERONE ACETATE 150 MG/ML IM SUSP
150.0000 mg | Freq: Once | INTRAMUSCULAR | Status: AC
  Administered 2012-09-24: 150 mg via INTRAMUSCULAR

## 2012-09-24 NOTE — Progress Notes (Signed)
Patient presented to the office today for a sonohysterogram as part of her evaluation for dysfunctional uterine bleeding. Patient was seen in the office on August 12. Patient was having also some vasomotor like symptoms.Her husband has had a vasectomy. Patient states that for the past 6 months she's had 2 periods per month lasting 5-7 days. She has episodes of hot flashes irritability at times mood swings but no true vaginal dryness or node decreased libido. She stated her mother reached menopause at the age of 66. Patient denied any nipple discharge, unusual headaches or visual disturbances.  Her recent lab work consisted of TSH, prolactin and FSH and CBC were all in the normal range. She's not bleeding today. Her recent endometrial biopsy demonstrated the following:  Endometrium, curettage, uterus - BENIGN PROLIFERATIVE ENDOMETRIUM, NO ATYPIA, HYPERPLASIA OR MALIGNANCY  Ultrasound today as follows: Uterus measuring 9.1 x 5.8 x 4.6 cm with endometrial stripe of 12.6 mm. (Last menstrual period started 08/28/2012) right ovary thinwall cyst measuring 31 x 23 x 3 1 mm with a thin septum and 9 mm was noted. Otherwise avascular. Left ovarian avascular follicle measuring 19 x 15 mm was noted. No fluid in the cul-de-sac. Sonohysterogram after instilling normal saline did not demonstrate any defects in the intrauterine cavity.  Assessment/plan: Episode of breakthrough bleeding may have been attributed to ovarian cyst producing an extra estrogen. Patient will receive Depo-Provera injection 150 mg IM today and will return to the office in 3 months for followup ultrasound. A CA 125 will be ordered today with his limitation discussed with the patient.

## 2012-09-24 NOTE — Addendum Note (Signed)
Addended by: Bertram Savin A on: 09/24/2012 02:24 PM   Modules accepted: Orders

## 2012-09-25 LAB — CA 125: CA 125: 18.3 U/mL (ref 0.0–30.2)

## 2012-11-06 ENCOUNTER — Other Ambulatory Visit: Payer: Self-pay

## 2012-11-06 DIAGNOSIS — Z1231 Encounter for screening mammogram for malignant neoplasm of breast: Secondary | ICD-10-CM

## 2012-12-03 ENCOUNTER — Ambulatory Visit
Admission: RE | Admit: 2012-12-03 | Discharge: 2012-12-03 | Disposition: A | Discharge status: Home / Self Care | Payer: 59 | Source: Ambulatory Visit

## 2012-12-03 DIAGNOSIS — Z1231 Encounter for screening mammogram for malignant neoplasm of breast: Secondary | ICD-10-CM

## 2012-12-04 ENCOUNTER — Other Ambulatory Visit: Payer: Self-pay

## 2012-12-22 ENCOUNTER — Ambulatory Visit (INDEPENDENT_AMBULATORY_CARE_PROVIDER_SITE_OTHER): Payer: 59

## 2012-12-22 ENCOUNTER — Ambulatory Visit (INDEPENDENT_AMBULATORY_CARE_PROVIDER_SITE_OTHER): Payer: 59 | Admitting: Gynecology

## 2012-12-22 DIAGNOSIS — N83209 Unspecified ovarian cyst, unspecified side: Secondary | ICD-10-CM

## 2012-12-22 DIAGNOSIS — N83201 Unspecified ovarian cyst, right side: Secondary | ICD-10-CM

## 2012-12-22 DIAGNOSIS — Z23 Encounter for immunization: Secondary | ICD-10-CM

## 2012-12-22 NOTE — Patient Instructions (Signed)
Influenza Vaccine (Flu Vaccine, Inactivated) 2013 2014 What You Need to Know WHY GET VACCINATED?  Influenza ("flu") is a contagious disease that spreads around the [place] every winter, usually between October and May.  Flu is caused by the influenza virus, and can be spread by coughing, sneezing, and close contact.  Anyone can get flu, but the risk of getting flu is highest among children. Symptoms come on suddenly and may last several days. They can include:  Fever or chills.  Sore throat.  Muscle aches.  Fatigue.  Cough.  Headache.  Runny or stuffy nose. Flu can make some people much sicker than others. These people include young children, people 65 and older, pregnant women, and people with certain health conditions such as heart, lung or kidney disease, or a weakened immune system. Flu vaccine is especially important for these people, and anyone in close contact with them. Flu can also lead to pneumonia, and make existing medical conditions worse. It can cause diarrhea and seizures in children. Each year thousands of people in the [place] die from flu, and many more are hospitalized. Flu vaccine is the best protection we have from flu and its complications. Flu vaccine also helps prevent spreading flu from person to person. INACTIVATED FLU VACCINE There are 2 types of influenza vaccine:  You are getting an inactivated flu vaccine, which does not contain any live influenza virus. It is given by injection with a needle, and often called the "flu shot."  A different live, attenuated (weakened) influenza vaccine is sprayed into the nostrils. This vaccine is described in a separate Vaccine Information Statement. Flu vaccine is recommended every year. Children 6 months through 8 years of age should get 2 doses the first year they get vaccinated. Flu viruses are always changing. Each year's flu vaccine is made to protect from viruses that are most likely to cause disease  that year. While flu vaccine cannot prevent all cases of flu, it is our best defense against the disease. Inactivated flu vaccine protects against 3 or 4 different influenza viruses. It takes about 2 weeks for protection to develop after the vaccination, and protection lasts several months to a year. Some illnesses that are not caused by influenza virus are often mistaken for flu. Flu vaccine will not prevent these illnesses. It can only prevent influenza. A "high-dose" flu vaccine is available for people 65 years of age and older. The person giving you the vaccine can tell you more about it. Some inactivated flu vaccine contains a very small amount of a mercury-based preservative called thimerosal. Studies have shown that thimerosal in vaccines is not harmful, but flu vaccines that do not contain a preservative are available. SOME PEOPLE SHOULD NOT GET THIS VACCINE Tell the person who gives you the vaccine:  If you have any severe (life-threatening) allergies. If you ever had a life-threatening allergic reaction after a dose of flu vaccine, or have a severe allergy to any part of this vaccine, you may be advised not to get a dose. Most, but not all, types of flu vaccine contain a small amount of egg.  If you ever had Guillain Barr Syndrome (a severe paralyzing illness, also called GBS). Some people with a history of GBS should not get this vaccine. This should be discussed with your doctor.  If you are not feeling well. They might suggest waiting until you feel better. But you should come back. RISKS OF A VACCINE REACTION With a vaccine, like any medicine, there   If you are not feeling well. They might suggest waiting until you feel better. But you should come back.  RISKS OF A VACCINE REACTION  With a vaccine, like any medicine, there is a chance of side effects. These are usually mild and go away on their own.  Serious side effects are also possible, but are very rare. Inactivated flu vaccine does not contain live flu virus, sogetting flu from this vaccine is not possible.  Brief fainting spells and related symptoms (such as jerking movements) can happen after any medical  procedure, including vaccination. Sitting or lying down for about 15 minutes after a vaccination can help prevent fainting and injuries caused by falls. Tell your doctor if you feel dizzy or lightheaded, or have vision changes or ringing in the ears.  Mild problems following inactivated flu vaccine:  · Soreness, redness, or swelling where the shot was given.  · Hoarseness; sore, red or itchy eyes; or cough.  · Fever.  · Aches.  · Headache.  · Itching.  · Fatigue.  If these problems occur, they usually begin soon after the shot and last 1 or 2 days.  Moderate problems following inactivated flu vaccine:  · Young children who get inactivated flu vaccine and pneumococcal vaccine (PCV13) at the same time may be at increased risk for seizures caused by fever. Ask your doctor for more information. Tell your doctor if a child who is getting flu vaccine has ever had a seizure.  Severe problems following inactivated flu vaccine:  · A severe allergic reaction could occur after any vaccine (estimated less than 1 in a million doses).  · There is a small possibility that inactivated flu vaccine could be associated with Guillan Barré Syndrome (GBS), no more than 1 or 2 cases per million people vaccinated. This is much lower than the risk of severe complications from flu, which can be prevented by flu vaccine.  The safety of vaccines is always being monitored. For more information, visit: www.cdc.gov/vaccinesafety/  WHAT IF THERE IS A SERIOUS REACTION?  What should I look for?  · Look for anything that concerns you, such as signs of a severe allergic reaction, very high fever, or behavior changes.  Signs of a severe allergic reaction can include hives, swelling of the face and throat, difficulty breathing, a fast heartbeat, dizziness, and weakness. These would start a few minutes to a few hours after the vaccination.  What should I do?  · If you think it is a severe allergic reaction or other emergency that cannot wait, call 9 1 1  or get the person to the nearest hospital. Otherwise, call your doctor.  · Afterward, the reaction should be reported to the Vaccine Adverse Event Reporting System (VAERS). Your doctor might file this report, or you can do it yourself through the VAERS website at www.vaers.hhs.gov, or by calling 1-800-822-7967.  VAERS is only for reporting reactions. They do not give medical advice.  THE NATIONAL VACCINE INJURY COMPENSATION PROGRAM  The National Vaccine Injury Compensation Program (VICP) is a federal program that was created to compensate people who may have been injured by certain vaccines.  Persons who believe they may have been injured by a vaccine can learn about the program and about filing a claim by calling 1-800-338-2382 or visiting the VICP website at www.hrsa.gov/vaccinecompensation  HOW CAN I LEARN MORE?  · Ask your doctor.  · Call your local or state health department.  · Contact the Centers for Disease Control and Prevention (CDC):  ·

## 2012-12-22 NOTE — Progress Notes (Signed)
Patient presented to the office for 3 months followup ultrasound. Patient was seen on 09/24/2012 complaining of irregular menstrual cycle. She had a normal TSH and prolactin as well as FSH and CBC. She also had a normal endometrial biopsy. She had an ultrasound which demonstrated the following:  Ultrasound today as follows:  Uterus measuring 9.1 x 5.8 x 4.6 cm with endometrial stripe of 12.6 mm. (Last menstrual period started 08/28/2012) right ovary thinwall cyst measuring 31 x 23 x 3 1 mm with a thin septum and 9 mm was noted. Otherwise avascular. Left ovarian avascular follicle measuring 19 x 15 mm was noted. No fluid in the cul-de-sac. Sonohysterogram after instilling normal saline did not demonstrate any defects in the intrauterine cavity.  She had a normal CA 125 at that office visit and received a shot of Depo-Provera 150 mg IM. Patient is having no complaints and her cycles are very light and regular since that isolated episode.  Ultrasound today: Uterus measuring 9.3 x 5.5 x 4.0 cm with endometrial stripe of 4.3 mm. Right ovarian thin-walled cyst measuring 22 x 25 x 30 mm with echogenic floating debris avascular and a very thin septum noted. The excess of bowel activity was noted difficult to evaluate left ovary.  Assessment/plan: Perimenopausal irregular bleeding normal TSH, prolactin, endometrial biopsy recently. Small right ovarian cyst regressing in size. Normal CA 125 recently. We'll repeat ultrasound at time of her anal exam in March of next year. Patient received the flu vaccine today.

## 2013-02-13 ENCOUNTER — Encounter: Payer: Self-pay | Admitting: Gynecology

## 2013-02-13 ENCOUNTER — Other Ambulatory Visit: Payer: Self-pay | Admitting: Gynecology

## 2013-02-13 NOTE — Telephone Encounter (Signed)
Needs annual exam with ultrasound March 2015

## 2013-02-13 NOTE — Telephone Encounter (Signed)
Called into pharmacy

## 2013-04-15 ENCOUNTER — Other Ambulatory Visit: Payer: Self-pay | Admitting: Dermatology

## 2013-04-22 ENCOUNTER — Encounter: Payer: 59 | Admitting: Gynecology

## 2013-04-22 ENCOUNTER — Other Ambulatory Visit: Payer: 59

## 2013-04-30 ENCOUNTER — Ambulatory Visit (INDEPENDENT_AMBULATORY_CARE_PROVIDER_SITE_OTHER): Payer: 59 | Admitting: Women's Health

## 2013-04-30 ENCOUNTER — Encounter: Payer: Self-pay | Admitting: Women's Health

## 2013-04-30 DIAGNOSIS — K625 Hemorrhage of anus and rectum: Secondary | ICD-10-CM | POA: Insufficient documentation

## 2013-04-30 NOTE — Progress Notes (Signed)
Patient ID: Amanda Zuniga, female   DOB: 30-Apr-1965, 48 y.o.   MRN: [id number] Presents with painless bright red blood with clots in stool x2. Denies constipation, abdominal pain, fever, urinary symptoms or vaginal discharge. Both times not with menstrual cycle or straining. Denies change in activity or heavy lifting. Denies change in caliber of stool or color. Irregular cycles/vasectomy. Denies any family history of colon cancer.  Exam: Appears well. External genitalia within normal limits, no visible blood in the vagina. No visible external hemorrhoids, rectal exam negative with no visible blood, fissures, palpable hemorrhoids.  Painless bright red bleeding x2  Plan: Will schedule GI consult for colonoscopy.

## 2013-04-30 NOTE — Patient Instructions (Signed)
Rectal Bleeding Rectal bleeding is when blood passes out of the anus. It is usually a sign that something is wrong. It may not be serious, but it should always be evaluated. Rectal bleeding may present as bright red blood or extremely dark stools. The color may range from dark red or maroon to black (like tar). It is important that the cause of rectal bleeding be identified so treatment can be started and the problem corrected. CAUSES   Hemorrhoids. These are enlarged (dilated) blood vessels or veins in the anal or rectal area.  Fistulas. Theseare abnormal, burrowing channels that usually run from inside the rectum to the skin around the anus. They can bleed.  Anal fissures. This is a tear in the tissue of the anus. Bleeding occurs with bowel movements.  Diverticulosis. This is a condition in which pockets or sacs project from the bowel wall. Occasionally, the sacs can bleed.  Diverticulitis. Thisis an infection involving diverticulosis of the colon.  Proctitis and colitis. These are conditions in which the rectum, colon, or both, can become inflamed and pitted (ulcerated).  Polyps and cancer. Polyps are non-cancerous (benign) growths in the colon that may bleed. Certain types of polyps turn into cancer.  Protrusion of the rectum. Part of the rectum can project from the anus and bleed.  Certain medicines.  Intestinal infections.  Blood vessel abnormalities. HOME CARE INSTRUCTIONS  Eat a high-fiber diet to keep your stool soft.  Limit activity.  Drink enough fluids to keep your urine clear or pale yellow.  Warm baths may be useful to soothe rectal pain.  Follow up with your caregiver as directed. SEEK IMMEDIATE MEDICAL CARE IF:  You develop increased bleeding.  You have black or dark red stools.  You vomit blood or material that looks like coffee grounds.  You have abdominal pain or tenderness.  You have a fever.  You feel weak, nauseous, or you faint.  You have  severe rectal pain or you are unable to have a bowel movement. MAKE SURE YOU:  Understand these instructions.  Will watch your condition.  Will get help right away if you are not doing well or get worse. Document Released: 07/07/2001 Document Revised: 04/09/2011 Document Reviewed: 07/02/2010 ExitCare Patient Information 2014 [employer].  

## 2013-05-04 ENCOUNTER — Telehealth: Payer: Self-pay | Admitting: *Deleted

## 2013-05-04 ENCOUNTER — Encounter: Payer: Self-pay | Admitting: Gastroenterology

## 2013-05-04 DIAGNOSIS — K625 Hemorrhage of anus and rectum: Secondary | ICD-10-CM

## 2013-05-04 NOTE — Telephone Encounter (Signed)
Appointment on 06/26/13 @ 9:00 am with Dr.Kapplen at [facility] pt informed.

## 2013-05-04 NOTE — Telephone Encounter (Signed)
Message copied by Thamas Jaegers on Mon May 04, 2013 12:16 PM ------      Message from: [place], [place]      Created: Thu Apr 30, 2013  4:00 PM       Please schedule GI consult/colonoscopy for bright red rectal bleeding x 2 no hemorrhoids. Monday and Friday are best ------

## 2013-05-13 ENCOUNTER — Other Ambulatory Visit: Payer: Self-pay | Admitting: Gynecology

## 2013-05-13 ENCOUNTER — Encounter: Payer: Self-pay | Admitting: Gynecology

## 2013-05-13 ENCOUNTER — Ambulatory Visit (INDEPENDENT_AMBULATORY_CARE_PROVIDER_SITE_OTHER): Payer: 59 | Admitting: Gynecology

## 2013-05-13 ENCOUNTER — Ambulatory Visit (INDEPENDENT_AMBULATORY_CARE_PROVIDER_SITE_OTHER): Payer: 59

## 2013-05-13 VITALS — BP 120/78 | Ht 65.75 in | Wt 147.0 lb

## 2013-05-13 DIAGNOSIS — N83209 Unspecified ovarian cyst, unspecified side: Secondary | ICD-10-CM

## 2013-05-13 DIAGNOSIS — N83201 Unspecified ovarian cyst, right side: Secondary | ICD-10-CM

## 2013-05-13 DIAGNOSIS — Z01818 Encounter for other preprocedural examination: Secondary | ICD-10-CM

## 2013-05-13 DIAGNOSIS — R9389 Abnormal findings on diagnostic imaging of other specified body structures: Secondary | ICD-10-CM

## 2013-05-13 DIAGNOSIS — N949 Unspecified condition associated with female genital organs and menstrual cycle: Secondary | ICD-10-CM

## 2013-05-13 DIAGNOSIS — N938 Other specified abnormal uterine and vaginal bleeding: Secondary | ICD-10-CM

## 2013-05-13 DIAGNOSIS — N831 Corpus luteum cyst of ovary, unspecified side: Secondary | ICD-10-CM

## 2013-05-13 LAB — COMPREHENSIVE METABOLIC PANEL
ALBUMIN: 4.4 g/dL (ref 3.5–5.2)
ALK PHOS: 80 U/L (ref 39–117)
ALT: 20 U/L (ref 0–35)
AST: 19 U/L (ref 0–37)
BUN: 16 mg/dL (ref 6–23)
CO2: 27 mEq/L (ref 19–32)
Calcium: 9.5 mg/dL (ref 8.4–10.5)
Chloride: 101 mEq/L (ref 96–112)
Creat: 0.85 mg/dL (ref 0.50–1.10)
Glucose, Bld: 86 mg/dL (ref 70–99)
POTASSIUM: 4.4 meq/L (ref 3.5–5.3)
SODIUM: 138 meq/L (ref 135–145)
Total Bilirubin: 0.6 mg/dL (ref 0.2–1.2)
Total Protein: 7.4 g/dL (ref 6.0–8.3)

## 2013-05-13 LAB — CBC WITH DIFFERENTIAL/PLATELET
BASOS ABS: 0 10*3/uL (ref 0.0–0.1)
Basophils Relative: 0 % (ref 0–1)
Eosinophils Absolute: 0 10*3/uL (ref 0.0–0.7)
Eosinophils Relative: 0 % (ref 0–5)
HEMATOCRIT: 42.5 % (ref 36.0–46.0)
Hemoglobin: 14.6 g/dL (ref 12.0–15.0)
LYMPHS PCT: 28 % (ref 12–46)
Lymphs Abs: 2.3 10*3/uL (ref 0.7–4.0)
MCH: 30.4 pg (ref 26.0–34.0)
MCHC: 34.4 g/dL (ref 30.0–36.0)
MCV: 88.4 fL (ref 78.0–100.0)
MONO ABS: 0.6 10*3/uL (ref 0.1–1.0)
Monocytes Relative: 7 % (ref 3–12)
NEUTROS PCT: 65 % (ref 43–77)
Neutro Abs: 5.4 10*3/uL (ref 1.7–7.7)
Platelets: 366 10*3/uL (ref 150–400)
RBC: 4.81 MIL/uL (ref 3.87–5.11)
RDW: 12.7 % (ref 11.5–15.5)
WBC: 8.3 10*3/uL (ref 4.0–10.5)

## 2013-05-13 LAB — LIPID PANEL
Cholesterol: 256 mg/dL — ABNORMAL HIGH (ref 0–200)
HDL: 57 mg/dL (ref >39)
LDL Cholesterol: 172 mg/dL — ABNORMAL HIGH (ref 0–99)
TRIGLYCERIDES: 133 mg/dL (ref <150)
Total CHOL/HDL Ratio: 4.5 Ratio
VLDL: 27 mg/dL (ref 0–40)

## 2013-05-13 LAB — TSH: TSH: 1.606 u[IU]/mL (ref 0.350–4.500)

## 2013-05-13 NOTE — Patient Instructions (Signed)
Ovarian Cyst An ovarian cyst is a fluid-filled sac that forms on an ovary. The ovaries are small organs that produce eggs in women. Various types of cysts can form on the ovaries. Most are not cancerous. Many do not cause problems, and they often go away on their own. Some may cause symptoms and require treatment. Common types of ovarian cysts include:  Functional cysts These cysts may occur every month during the menstrual cycle. This is normal. The cysts usually go away with the next menstrual cycle if the woman does not get pregnant. Usually, there are no symptoms with a functional cyst.  Endometrioma cysts These cysts form from the tissue that lines the uterus. They are also called "chocolate cysts" because they become filled with blood that turns brown. This type of cyst can cause pain in the lower abdomen during intercourse and with your menstrual period.  Cystadenoma cysts This type develops from the cells on the outside of the ovary. These cysts can get very big and cause lower abdomen pain and pain with intercourse. This type of cyst can twist on itself, cut off its blood supply, and cause severe pain. It can also easily rupture and cause a lot of pain.  Dermoid cysts This type of cyst is sometimes found in both ovaries. These cysts may contain different kinds of body tissue, such as skin, teeth, hair, or cartilage. They usually do not cause symptoms unless they get very big.  Theca lutein cysts These cysts occur when too much of a certain hormone (human chorionic gonadotropin) is produced and overstimulates the ovaries to produce an egg. This is most common after procedures used to assist with the conception of a baby (in vitro fertilization). CAUSES   Fertility drugs can cause a condition in which multiple large cysts are formed on the ovaries. This is called ovarian hyperstimulation syndrome.  A condition called polycystic ovary syndrome can cause hormonal imbalances that can lead to  nonfunctional ovarian cysts. SIGNS AND SYMPTOMS  Many ovarian cysts do not cause symptoms. If symptoms are present, they may include:  Pelvic pain or pressure.  Pain in the lower abdomen.  Pain during sexual intercourse.  Increasing girth (swelling) of the abdomen.  Abnormal menstrual periods.  Increasing pain with menstrual periods.  Stopping having menstrual periods without being pregnant. DIAGNOSIS  These cysts are commonly found during a routine or annual pelvic exam. Tests may be ordered to find out more about the cyst. These tests may include:  Ultrasound.  X-ray of the pelvis.  CT scan.  MRI.  Blood tests. TREATMENT  Many ovarian cysts go away on their own without treatment. Your health care provider may want to check your cyst regularly for 2 3 months to see if it changes. For women in menopause, it is particularly important to monitor a cyst closely because of the higher rate of ovarian cancer in menopausal women. When treatment is needed, it may include any of the following:  A procedure to drain the cyst (aspiration). This may be done using a long needle and ultrasound. It can also be done through a laparoscopic procedure. This involves using a thin, lighted tube with a tiny camera on the end (laparoscope) inserted through a small incision.  Surgery to remove the whole cyst. This may be done using laparoscopic surgery or an open surgery involving a larger incision in the lower abdomen.  Hormone treatment or birth control pills. These methods are sometimes used to help dissolve a cyst. HOME CARE  open surgery involving a larger incision in the lower abdomen.  · Hormone treatment or birth control pills. These methods are sometimes used to help dissolve a cyst.  HOME CARE INSTRUCTIONS   · Only take over-the-counter or prescription medicines as directed by your health care provider.  · Follow up with your health care provider as directed.  · Get regular pelvic exams and Pap tests.  SEEK MEDICAL CARE IF:   · Your periods are late, irregular, or painful, or they stop.  · Your pelvic pain or abdominal pain does not go away.  · Your abdomen becomes  larger or swollen.  · You have pressure on your bladder or trouble emptying your bladder completely.  · You have pain during sexual intercourse.  · You have feelings of fullness, pressure, or discomfort in your stomach.  · You lose weight for no apparent reason.  · You feel generally ill.  · You become constipated.  · You lose your appetite.  · You develop acne.  · You have an increase in body and facial hair.  · You are gaining weight, without changing your exercise and eating habits.  · You think you are pregnant.  SEEK IMMEDIATE MEDICAL CARE IF:   · You have increasing abdominal pain.  · You feel sick to your stomach (nauseous), and you throw up (vomit).  · You develop a fever that comes on suddenly.  · You have abdominal pain during a bowel movement.  · Your menstrual periods become heavier than usual.  Document Released: 01/15/2005 Document Revised: 11/05/2012 Document Reviewed: 09/22/2012  ExitCare® Patient Information ©2014 [employer].  Diagnostic Laparoscopy  Laparoscopy is a surgical procedure. It is used to diagnose and treat diseases inside the belly (abdomen). It is usually a brief, common, and relatively simple procedure. The laparoscopeis a thin, lighted, pencil-sized instrument. It is like a telescope. It is inserted into your abdomen through a small cut (incision). Your caregiver can look at the organs inside your body through this instrument. He or she can see if there is anything abnormal.  Laparoscopy can be done either in a hospital or outpatient clinic. You may be given a mild sedative to help you relax before the procedure. Once in the operating room, you will be given a drug to make you sleep (general anesthesia). Laparoscopy usually lasts less than 1 hour. After the procedure, you will be monitored in a recovery area until you are stable and doing well. Once you are home, it will take 2 to 3 days to fully recover.  RISKS AND COMPLICATIONS   Laparoscopy has relatively few risks. Your  caregiver will discuss the risks with you before the procedure.  Some problems that can occur include:  · Infection.  · Bleeding.  · Damage to other organs.  · Anesthetic side effects.  PROCEDURE  Once you receive anesthesia, your surgeon inflates the abdomen with a harmless gas (carbon dioxide). This makes the organs easier to see. The laparoscope is inserted into the abdomen through a small incision. This allows your surgeon to see into the abdomen. Other small instruments are also inserted into the abdomen through other small openings. Many surgeons attach a video camera to the laparoscope to enlarge the view.  During a diagnostic laparoscopy, the surgeon may be looking for inflammation, infection, or cancer. Your surgeon may take tissue samples(biopsies). The samples are sent to a specialist in looking at cells and tissue samples (pathologist). The pathologist examines them under a microscope. Biopsies can   help to diagnose or confirm a disease.  AFTER THE PROCEDURE   · The gas is released from inside the abdomen.  · The incisions are closed with stitches (sutures). Because these incisions are small (usually less than 1/2 inch), there is usually minimal discomfort after the procedure. There may be some mild discomfort in the throat. This is from the tube placed in the throat while you were sleeping. You may have some mild abdominal discomfort. There may also be discomfort from the instrument placement incisions in the abdomen.  · The recovery time is shortened as long as there are no complications.  · You will rest in a recovery room until stable and doing well. As long as there are no complications, you may be allowed to go home.  FINDING OUT THE RESULTS OF YOUR TEST  Not all test results are available during your visit. If your test results are not back during the visit, make an appointment with your caregiver to find out the results. Do not assume everything is normal if you have not heard from your caregiver  or the medical facility. It is important for you to follow up on all of your test results.  HOME CARE INSTRUCTIONS   · Take all medicines as directed.  · Only take over-the-counter or prescription medicines for pain, discomfort, or fever as directed by your caregiver.  · Resume daily activities as directed.  · Showers are preferred over baths.  · You may resume sexual activities in 1 week or as directed.  · Do not drive while taking narcotics.  SEEK MEDICAL CARE IF:   · There is increasing abdominal pain.  · There is new pain in the shoulders (shoulder strap areas).  · You feel lightheaded or faint.  · You have the chills.  · You or your child has an oral temperature above 102° F (38.9° C).  · There is pus-like (purulent) drainage from any of the wounds.  · You are unable to pass gas or have a bowel movement.  · You feel sick to your stomach (nauseous) or throw up (vomit).  MAKE SURE YOU:   · Understand these instructions.  · Will watch your condition.  · Will get help right away if you are not doing well or get worse.  Document Released: 04/23/2000 Document Revised: 05/12/2012 Document Reviewed: 01/15/2007  ExitCare® Patient Information ©2014 [employer].

## 2013-05-13 NOTE — Progress Notes (Signed)
Amanda Zuniga is an 48 y.o. female. Who is here for her annual exam but as well as for preop exam and consultation as a result of persistent right ovarian cyst and now left ovarian cyst as well. Her past history as follows:  She had a normal TSH and prolactin as well as FSH and CBC. She also had a normal endometrial biopsy. She had an ultrasound which demonstrated the following:  Ultrasound today as follows:  Uterus measuring 9.1 x 5.8 x 4.6 cm with endometrial stripe of 12.6 mm. (Last menstrual period started 08/28/2012) right ovary thinwall cyst measuring 31 x 23 x 3 1 mm with a thin septum and 9 mm was noted. Otherwise avascular. Left ovarian avascular follicle measuring 19 x 15 mm was noted. No fluid in the cul-de-sac. Sonohysterogram after instilling normal saline did not demonstrate any defects in the intrauterine cavity.  She had a normal CA 125 at that office visit and received a shot of Depo-Provera 150 mg IM. Patient is having no complaints and her cycles are very light and regular since that isolated episode.  On 11/22/2012 she had an ultrasound for followup which demonstrated the following:  Ultrasound today:  Uterus measuring 9.3 x 5.5 x 4.0 cm with endometrial stripe of 4.3 mm. Right ovarian thin-walled cyst measuring 22 x 25 x 30 mm with echogenic floating debris avascular and a very thin septum noted. The excess of bowel activity was noted difficult to evaluate left ovary.  On today's visit ultrasound demonstrated the following: Uterus measuring 9.4 x 5.4 x 4.2 cm with endometrial stripe 13.7 mm (last menstrual period 04/20/2013). Right ovary thinwall avascular cyst measuring 32 x 26 x 24 mm average size 27 mm echogenic floating debris slight increase in size from previous scan. Now she had a left ovary thinwall cyst measuring 37 x 36 x 28 mm average size 34 mm. Mean reticular echo pattern noted. Arterial blood flow was seen in the periphery of both ovaries. No fluid in the  cul-de-sac.patient was informed me that she has had hematochezia 2 times one month and has a scheduled consult appointment with gastroenterologist in a few weeks.  The patient will be scheduled for laparoscopic bilateral ovarian cystectomy.   Pertinent Gynecological History: Menses: intermenstrual bleeding Bleeding: intermenstrual bleeding Contraception: vasectomy DES exposure: denies Blood transfusions: none Sexually transmitted diseases: no past history Previous GYN Procedures: 1 NSVD, 1 csec  Last mammogram: normal Date: 2014  Last pap: normal Date: 2013 OB History: G4, P2A2 (1 ectopic/ methotrexate)   Menstrual History: Menarche age: 14  Patient's last menstrual period was 04/20/2013.    Past Medical History  Diagnosis Date  . NSVD (normal spontaneous vaginal delivery)     macrosomia  . CIN I (cervical intraepithelial neoplasia I)   . Ectopic pregnancy     HISTORY OF ECTOPIC/METHOTREXATE  . Diabetes mellitus     GESTATIONAL DIABETES  . H/O: vasectomy     HUSBAND HAS HAD VASECTOMY  . BCC (basal cell carcinoma of skin)     chest and back and right leg  . Anxiety     Past Surgical History  Procedure Laterality Date  . Cervical biopsy  w/ loop electrode excision    . Cesarean section  2004    33 weeks-hyprops, fetal tachycardia    Family History  Problem Relation Age of Onset  . Cancer Mother     BLADDER CANCER  . Hypertension Mother     Social History:  reports that she  has never smoked. She has never used smokeless tobacco. She reports that she drinks alcohol. She reports that she does not use illicit drugs.  Allergies: No Known Allergies   (Not in a hospital admission)  REVIEW OF SYSTEMS: A ROS was performed and pertinent positives and negatives are included in the history.  GENERAL: No fevers or chills. HEENT: No change in vision, no earache, sore throat or sinus congestion. NECK: No pain or stiffness. CARDIOVASCULAR: No chest pain or pressure. No  palpitations. PULMONARY: No shortness of breath, cough or wheeze. GASTROINTESTINAL: No abdominal pain, nausea, vomiting or diarrhea, melena or bright red blood per rectum. GENITOURINARY: No urinary frequency, urgency, hesitancy or dysuria. MUSCULOSKELETAL: No joint or muscle pain, no back pain, no recent trauma. DERMATOLOGIC: No rash, no itching, no lesions. ENDOCRINE: No polyuria, polydipsia, no heat or cold intolerance. No recent change in weight. HEMATOLOGICAL: No anemia or easy bruising or bleeding. NEUROLOGIC: No headache, seizures, numbness, tingling or weakness. PSYCHIATRIC: No depression, no loss of interest in normal activity or change in sleep pattern.     Blood pressure 120/78, height 5' 5.75" (1.67 m), weight 147 lb (66.679 kg), last menstrual period 04/20/2013.  Physical Exam:  HEENT:unremarkable Neck:Supple, midline, no thyroid megaly, no carotid bruits Lungs:  Clear to auscultation no rhonchi's or wheezes Heart:Regular rate and rhythm, no murmurs or gallops Breast Exam: Both breasts are symmetrical in appearance no skin discoloration no nipple inversion no palpable masses on the left breast no supraclavicular axillary lymphadenopathy on either breast. A right small cystic area was noted at the 9:00 position 4 fingerbreadths from the nipple was once again noted and has been followed by ultrasound and mammograms are reported to be benign cyst. Most recent mammogram November 2014. Abdomen:soft nontender no rebound guarding Pfannenstiel scar was noted Pelvic:BUSwithin normal limits Vagina:no lesions or discharge Cervix:no lesions or discharge Uterus:anteverted no palpable masses or tenderness Adnexa:fullness was noted bilateral adnexa slightly tender Extremities: No cords, no edema Rectal:not done  As part of her dysfunctional uterine bleeding an endometrial biopsy was done with a sterile polyp L. after the cervix had been cleansed with Betadine solution and tissue is submitted for  histological evaluation.  Assessment/Plan: With bilateral ovarian cysts. Patient will be scheduled for laparoscopic bilateral ovarian cystectomy. A CA 125 will be drawn today its limitations discussed with the patient today. We will also do the following labs: CBC, fasting lipid profile, comprehensive metabolic panel, TSH, and urinalysis. Pap smear not done today report is new guidelines. The following risk for surgery were discussed with the patient:                        Patient was counseled as to the risk of surgery to include the following:  1. Infection (prohylactic antibiotics will be administered)  2. DVT/Pulmonary Embolism (prophylactic pneumo compression stockings will be used)  3.Trauma to internal organs requiring additional surgical procedure to repair any injury to     Internal organs requiring perhaps additional hospitalization days.  4.Hemmorhage requiring transfusion and blood products which carry risks such as anaphylactic reaction, hepatitis and AIDS  Patient had received literature information on the procedure scheduled and all her questions were answered and fully accepts all risk.   Terrance Mass 05/13/2013, 10:28 AM  Note: This dictation was prepared with  Dragon/digital dictation along withSmart phrase technology. Any transcriptional errors that result from this process are unintentional.

## 2013-05-13 NOTE — Addendum Note (Signed)
Addended by: Su Grand A on: 05/13/2013 11:17 AM   Modules accepted: Orders

## 2013-05-14 ENCOUNTER — Other Ambulatory Visit: Payer: Self-pay | Admitting: Gynecology

## 2013-05-14 DIAGNOSIS — E7800 Pure hypercholesterolemia, unspecified: Secondary | ICD-10-CM

## 2013-05-14 LAB — URINALYSIS W MICROSCOPIC + REFLEX CULTURE
BILIRUBIN URINE: NEGATIVE
CASTS: NONE SEEN
Crystals: NONE SEEN
Glucose, UA: NEGATIVE mg/dL
Hgb urine dipstick: NEGATIVE
KETONES UR: NEGATIVE mg/dL
Leukocytes, UA: NEGATIVE
Nitrite: NEGATIVE
PH: 5.5 (ref 5.0–8.0)
Protein, ur: NEGATIVE mg/dL
Specific Gravity, Urine: 1.019 (ref 1.005–1.030)
Urobilinogen, UA: 0.2 mg/dL (ref 0.0–1.0)

## 2013-05-14 LAB — CA 125: CA 125: 7.2 U/mL (ref 0.0–30.2)

## 2013-05-15 ENCOUNTER — Other Ambulatory Visit: Payer: Self-pay | Admitting: Gynecology

## 2013-05-15 MED ORDER — NITROFURANTOIN MONOHYD MACRO 100 MG PO CAPS: 100.0000 mg | ORAL_CAPSULE | Freq: Two times a day (BID) | ORAL | Status: DC

## 2013-05-16 LAB — URINE CULTURE: Colony Count: 15000

## 2013-06-12 ENCOUNTER — Other Ambulatory Visit: Payer: Self-pay | Admitting: Gynecology

## 2013-06-12 NOTE — Telephone Encounter (Signed)
rx called in

## 2013-06-16 ENCOUNTER — Encounter (HOSPITAL_COMMUNITY): Payer: Self-pay | Admitting: Pharmacist

## 2013-06-26 ENCOUNTER — Ambulatory Visit: Payer: 59 | Admitting: Gastroenterology

## 2013-06-30 MED ORDER — DEXTROSE 5 % IV SOLN
2.0000 g | INTRAVENOUS | Status: AC
  Administered 2013-07-01: 2 g via INTRAVENOUS
  Filled 2013-06-30: qty 2

## 2013-06-30 NOTE — H&P (Signed)
[facility]  Amanda Zuniga is an 48 y.o. female. Who is here for her annual exam but as well as for preop exam and consultation as a result of persistent right ovarian cyst and now left ovarian cyst as well. Her past history as follows:  She had a normal TSH and prolactin as well as FSH and CBC. She also had a normal endometrial biopsy. She had an ultrasound which demonstrated the following:   Ultrasound today as follows:  Uterus measuring 9.1 x 5.8 x 4.6 cm with endometrial stripe of 12.6 mm. (Last menstrual period started 08/28/2012) right ovary thinwall cyst measuring 31 x 23 x 3 1 mm with a thin septum and 9 mm was noted. Otherwise avascular. Left ovarian avascular follicle measuring 19 x 15 mm was noted. No fluid in the cul-de-sac. Sonohysterogram after instilling normal saline did not demonstrate any defects in the intrauterine cavity.   She had a normal CA 125 at that office visit and received a shot of Depo-Provera 150 mg IM. Patient is having no complaints and her cycles are very light and regular since that isolated episode.   On 11/22/2012 she had an ultrasound for followup which demonstrated the following:   Ultrasound today:  Uterus measuring 9.3 x 5.5 x 4.0 cm with endometrial stripe of 4.3 mm. Right ovarian thin-walled cyst measuring 22 x 25 x 30 mm with echogenic floating debris avascular and a very thin septum noted. The excess of bowel activity was noted difficult to evaluate left ovary.   On today's visit ultrasound demonstrated the following:  Uterus measuring 9.4 x 5.4 x 4.2 cm with endometrial stripe 13.7 mm (last menstrual period 04/20/2013). Right ovary thinwall avascular cyst measuring 32 x 26 x 24 mm average size 27 mm echogenic floating debris slight increase in size from previous scan. Now she had a left ovary thinwall cyst measuring 37 x 36 x 28 mm average size 34 mm. Mean reticular echo pattern noted. Arterial blood flow was seen in the periphery of both  ovaries. No fluid in the cul-de-sac.patient was informed me that she has had hematochezia 2 times one month and has a scheduled consult appointment with gastroenterologist in a few weeks.  The patient will be scheduled for laparoscopic bilateral ovarian cystectomy.   Pertinent Gynecological History:  Menses: intermenstrual bleeding  Bleeding: intermenstrual bleeding  Contraception: vasectomy  DES exposure: denies  Blood transfusions: none  Sexually transmitted diseases: no past history  Previous GYN Procedures: 1 NSVD, 1 csec  Last mammogram: normal Date: 2014  Last pap: normal Date: 2013  OB History: G4, P2A2 (1 ectopic/ methotrexate)   Menstrual History:  Menarche age: 3  Patient's last menstrual period was 04/20/2013.  Past Medical History   Diagnosis  Date   .  NSVD (normal spontaneous vaginal delivery)      macrosomia   .  CIN I (cervical intraepithelial neoplasia I)    .  Ectopic pregnancy      HISTORY OF ECTOPIC/METHOTREXATE   .  Diabetes mellitus      GESTATIONAL DIABETES   .  H/O: vasectomy      HUSBAND HAS HAD VASECTOMY   .  BCC (basal cell carcinoma of skin)      chest and back and right leg   .  Anxiety     Past Surgical History   Procedure  Laterality  Date   .  Cervical biopsy w/ loop electrode excision     .  Cesarean section  2004     33 weeks-hyprops, fetal tachycardia    Family History   Problem  Relation  Age of Onset   .  Cancer  Mother      BLADDER CANCER   .  Hypertension  Mother     Social History: reports that she has never smoked. She has never used smokeless tobacco. She reports that she drinks alcohol. She reports that she does not use illicit drugs.  Allergies: No Known Allergies  (Not in a hospital admission)   REVIEW OF SYSTEMS: A ROS was performed and pertinent positives and negatives are included in the history.  GENERAL: No fevers or chills. HEENT: No change in vision, no earache, sore throat or sinus congestion. NECK: No pain or  stiffness. CARDIOVASCULAR: No chest pain or pressure. No palpitations. PULMONARY: No shortness of breath, cough or wheeze. GASTROINTESTINAL: No abdominal pain, nausea, vomiting or diarrhea, melena or bright red blood per rectum. GENITOURINARY: No urinary frequency, urgency, hesitancy or dysuria. MUSCULOSKELETAL: No joint or muscle pain, no back pain, no recent trauma. DERMATOLOGIC: No rash, no itching, no lesions. ENDOCRINE: No polyuria, polydipsia, no heat or cold intolerance. No recent change in weight. HEMATOLOGICAL: No anemia or easy bruising or bleeding. NEUROLOGIC: No headache, seizures, numbness, tingling or weakness. PSYCHIATRIC: No depression, no loss of interest in normal activity or change in sleep pattern.  Blood pressure 120/78, height 5' 5.75" (1.67 m), weight 147 lb (66.679 kg), last menstrual period 04/20/2013.   Physical Exam:  HEENT:unremarkable  Neck:Supple, midline, no thyroid megaly, no carotid bruits  Lungs: Clear to auscultation no rhonchi's or wheezes  Heart:Regular rate and rhythm, no murmurs or gallops  Breast Exam: Both breasts are symmetrical in appearance no skin discoloration no nipple inversion no palpable masses on the left breast no supraclavicular axillary lymphadenopathy on either breast. A right small cystic area was noted at the 9:00 position 4 fingerbreadths from the nipple was once again noted and has been followed by ultrasound and mammograms are reported to be benign cyst. Most recent mammogram November 2014.  Abdomen:soft nontender no rebound guarding Pfannenstiel scar was noted  Pelvic:BUSwithin normal limits  Vagina:no lesions or discharge  Cervix:no lesions or discharge  Uterus:anteverted no palpable masses or tenderness  Adnexa:fullness was noted bilateral adnexa slightly tender  Extremities: No cords, no edema  Rectal:not done  As part of her dysfunctional uterine bleeding an endometrial biopsy was done with a sterile polyp L. after the cervix had been  cleansed with Betadine solution and tissue is submitted for histological evaluation.   Assessment/Plan:  With bilateral ovarian cysts. Patient will be scheduled for laparoscopic bilateral ovarian cystectomy. Patient's recent CA 125 was 7.2.   The following risk for surgery were discussed with the patient:  Patient was counseled as to the risk of surgery to include the following:  1. Infection (prohylactic antibiotics will be administered)  2. DVT/Pulmonary Embolism (prophylactic pneumo compression stockings will be used)  3.Trauma to internal organs requiring additional surgical procedure to repair any injury to  Internal organs requiring perhaps additional hospitalization days.  4.Hemmorhage requiring transfusion and blood products which carry risks such as anaphylactic reaction, hepatitis and AIDS  Patient had received literature information on the procedure scheduled and all her questions were answered and fully accepts all risk.  Terrance Mass  05/13/2013, 10:28 AM  Note: This dictation was prepared with Dragon/digital dictation along withSmart phrase technology. Any transcriptional errors that result from this process are unintentional.

## 2013-07-01 ENCOUNTER — Ambulatory Visit (HOSPITAL_COMMUNITY): Payer: 59 | Admitting: Anesthesiology

## 2013-07-01 ENCOUNTER — Encounter (HOSPITAL_COMMUNITY)
Admission: RE | Disposition: A | Discharge status: Home / Self Care | Payer: Self-pay | Source: Ambulatory Visit | Attending: Gynecology

## 2013-07-01 ENCOUNTER — Encounter (HOSPITAL_COMMUNITY): Payer: Self-pay | Admitting: Anesthesiology

## 2013-07-01 ENCOUNTER — Encounter (HOSPITAL_COMMUNITY): Payer: 59 | Admitting: Anesthesiology

## 2013-07-01 ENCOUNTER — Ambulatory Visit (HOSPITAL_COMMUNITY)
Admission: RE | Admit: 2013-07-01 | Discharge: 2013-07-01 | Disposition: A | Discharge status: Home / Self Care | Payer: 59 | Source: Ambulatory Visit | Attending: Gynecology | Admitting: Gynecology

## 2013-07-01 DIAGNOSIS — N8000 Endometriosis of the uterus, unspecified: Secondary | ICD-10-CM

## 2013-07-01 DIAGNOSIS — N83209 Unspecified ovarian cyst, unspecified side: Secondary | ICD-10-CM

## 2013-07-01 DIAGNOSIS — N803 Endometriosis of pelvic peritoneum, unspecified: Secondary | ICD-10-CM | POA: Insufficient documentation

## 2013-07-01 DIAGNOSIS — Z9889 Other specified postprocedural states: Secondary | ICD-10-CM

## 2013-07-01 DIAGNOSIS — N949 Unspecified condition associated with female genital organs and menstrual cycle: Secondary | ICD-10-CM | POA: Insufficient documentation

## 2013-07-01 DIAGNOSIS — N938 Other specified abnormal uterine and vaginal bleeding: Secondary | ICD-10-CM | POA: Insufficient documentation

## 2013-07-01 HISTORY — PX: LAPAROSCOPY: SHX197

## 2013-07-01 HISTORY — PX: ABLATION ON ENDOMETRIOSIS: SHX5787

## 2013-07-01 LAB — CBC
HEMATOCRIT: 41.1 % (ref 36.0–46.0)
Hemoglobin: 14 g/dL (ref 12.0–15.0)
MCH: 31 pg (ref 26.0–34.0)
MCHC: 34.1 g/dL (ref 30.0–36.0)
MCV: 90.9 fL (ref 78.0–100.0)
PLATELETS: 286 10*3/uL (ref 150–400)
RBC: 4.52 MIL/uL (ref 3.87–5.11)
RDW: 12.4 % (ref 11.5–15.5)
WBC: 8.2 10*3/uL (ref 4.0–10.5)

## 2013-07-01 LAB — URINALYSIS, ROUTINE W REFLEX MICROSCOPIC
BILIRUBIN URINE: NEGATIVE
Glucose, UA: NEGATIVE mg/dL
Ketones, ur: NEGATIVE mg/dL
Leukocytes, UA: NEGATIVE
NITRITE: NEGATIVE
Protein, ur: NEGATIVE mg/dL
SPECIFIC GRAVITY, URINE: 1.015 (ref 1.005–1.030)
Urobilinogen, UA: 0.2 mg/dL (ref 0.0–1.0)
pH: 5.5 (ref 5.0–8.0)

## 2013-07-01 LAB — URINE MICROSCOPIC-ADD ON

## 2013-07-01 LAB — PREGNANCY, URINE: PREG TEST UR: NEGATIVE

## 2013-07-01 SURGERY — LAPAROSCOPY OPERATIVE
Anesthesia: General | Site: Abdomen

## 2013-07-01 MED ORDER — PROPOFOL 10 MG/ML IV BOLUS
INTRAVENOUS | Status: DC | PRN
  Administered 2013-07-01: 20 mg via INTRAVENOUS
  Administered 2013-07-01: 160 mg via INTRAVENOUS

## 2013-07-01 MED ORDER — HEPARIN SODIUM (PORCINE) 5000 UNIT/ML IJ SOLN
INTRAMUSCULAR | Status: AC
  Filled 2013-07-01: qty 1

## 2013-07-01 MED ORDER — FENTANYL CITRATE 0.05 MG/ML IJ SOLN
INTRAMUSCULAR | Status: AC
  Filled 2013-07-01: qty 5

## 2013-07-01 MED ORDER — ROCURONIUM BROMIDE 100 MG/10ML IV SOLN
INTRAVENOUS | Status: AC
Start: 2013-07-01 — End: 2013-07-01
  Filled 2013-07-01: qty 1

## 2013-07-01 MED ORDER — NEOSTIGMINE METHYLSULFATE 10 MG/10ML IV SOLN
INTRAVENOUS | Status: AC
  Filled 2013-07-01: qty 1

## 2013-07-01 MED ORDER — METOCLOPRAMIDE HCL 10 MG PO TABS: 10.0000 mg | ORAL_TABLET | Freq: Three times a day (TID) | ORAL | Status: DC

## 2013-07-01 MED ORDER — PROPOFOL 10 MG/ML IV EMUL
INTRAVENOUS | Status: AC
  Filled 2013-07-01: qty 20

## 2013-07-01 MED ORDER — MIDAZOLAM HCL 2 MG/2ML IJ SOLN
INTRAMUSCULAR | Status: AC
  Filled 2013-07-01: qty 2

## 2013-07-01 MED ORDER — TRAMADOL HCL 50 MG PO TABS: 50.0000 mg | ORAL_TABLET | Freq: Four times a day (QID) | ORAL | Status: DC | PRN

## 2013-07-01 MED ORDER — ACETAMINOPHEN 160 MG/5ML PO SOLN
975.0000 mg | Freq: Once | ORAL | Status: AC
  Administered 2013-07-01: 975 mg via ORAL

## 2013-07-01 MED ORDER — BUPIVACAINE HCL (PF) 0.25 % IJ SOLN
INTRAMUSCULAR | Status: AC
  Filled 2013-07-01: qty 30

## 2013-07-01 MED ORDER — FENTANYL CITRATE 0.05 MG/ML IJ SOLN
INTRAMUSCULAR | Status: DC | PRN
  Administered 2013-07-01 (×4): 50 ug via INTRAVENOUS

## 2013-07-01 MED ORDER — SILVER NITRATE-POT NITRATE 75-25 % EX MISC
CUTANEOUS | Status: AC
  Filled 2013-07-01: qty 1

## 2013-07-01 MED ORDER — DEXAMETHASONE SODIUM PHOSPHATE 10 MG/ML IJ SOLN
INTRAMUSCULAR | Status: DC | PRN
  Administered 2013-07-01: 10 mg via INTRAVENOUS

## 2013-07-01 MED ORDER — SCOPOLAMINE 1 MG/3DAYS TD PT72
1.0000 | MEDICATED_PATCH | TRANSDERMAL | Status: DC
  Administered 2013-07-01: 1.5 mg via TRANSDERMAL

## 2013-07-01 MED ORDER — ONDANSETRON HCL 4 MG/2ML IJ SOLN
INTRAMUSCULAR | Status: DC | PRN
Start: 2013-07-01 — End: 2013-07-01
  Administered 2013-07-01: 4 mg via INTRAVENOUS

## 2013-07-01 MED ORDER — ACETAMINOPHEN 160 MG/5ML PO SOLN
ORAL | Status: AC
Start: 2013-07-01 — End: 2013-07-01
  Administered 2013-07-01: 975 mg via ORAL
  Filled 2013-07-01: qty 40.6

## 2013-07-01 MED ORDER — SCOPOLAMINE 1 MG/3DAYS TD PT72
MEDICATED_PATCH | TRANSDERMAL | Status: AC
  Administered 2013-07-01: 1.5 mg via TRANSDERMAL
  Filled 2013-07-01: qty 1

## 2013-07-01 MED ORDER — GLYCOPYRROLATE 0.2 MG/ML IJ SOLN
INTRAMUSCULAR | Status: AC
  Filled 2013-07-01: qty 2

## 2013-07-01 MED ORDER — HEPARIN SODIUM (PORCINE) 5000 UNIT/ML IJ SOLN
INTRAMUSCULAR | Status: DC | PRN
  Administered 2013-07-01: 5000 [IU]

## 2013-07-01 MED ORDER — METOCLOPRAMIDE HCL 5 MG/ML IJ SOLN
10.0000 mg | Freq: Once | INTRAMUSCULAR | Status: AC | PRN
  Administered 2013-07-01: 10 mg via INTRAVENOUS

## 2013-07-01 MED ORDER — METOCLOPRAMIDE HCL 5 MG/ML IJ SOLN
INTRAMUSCULAR | Status: AC
  Administered 2013-07-01: 10 mg via INTRAVENOUS
  Filled 2013-07-01: qty 2

## 2013-07-01 MED ORDER — MEPERIDINE HCL 25 MG/ML IJ SOLN: 6.2500 mg | INTRAMUSCULAR | Status: DC | PRN

## 2013-07-01 MED ORDER — KETOROLAC TROMETHAMINE 30 MG/ML IJ SOLN
INTRAMUSCULAR | Status: DC | PRN
  Administered 2013-07-01: 30 mg via INTRAVENOUS

## 2013-07-01 MED ORDER — ONDANSETRON HCL 4 MG/2ML IJ SOLN
INTRAMUSCULAR | Status: AC
  Filled 2013-07-01: qty 2

## 2013-07-01 MED ORDER — DEXAMETHASONE SODIUM PHOSPHATE 10 MG/ML IJ SOLN
INTRAMUSCULAR | Status: AC
  Filled 2013-07-01: qty 1

## 2013-07-01 MED ORDER — GLYCOPYRROLATE 0.2 MG/ML IJ SOLN
INTRAMUSCULAR | Status: DC | PRN
  Administered 2013-07-01: 0.4 mg via INTRAVENOUS
  Administered 2013-07-01: 0.3 mg via INTRAVENOUS

## 2013-07-01 MED ORDER — KETOROLAC TROMETHAMINE 30 MG/ML IJ SOLN: 15.0000 mg | Freq: Once | INTRAMUSCULAR | Status: DC | PRN

## 2013-07-01 MED ORDER — ROCURONIUM BROMIDE 100 MG/10ML IV SOLN
INTRAVENOUS | Status: DC | PRN
  Administered 2013-07-01: 35 mg via INTRAVENOUS

## 2013-07-01 MED ORDER — GLYCOPYRROLATE 0.2 MG/ML IJ SOLN
INTRAMUSCULAR | Status: AC
  Filled 2013-07-01: qty 3

## 2013-07-01 MED ORDER — METHYLENE BLUE 1 % INJ SOLN
INTRAMUSCULAR | Status: AC
  Filled 2013-07-01: qty 10

## 2013-07-01 MED ORDER — MIDAZOLAM HCL 2 MG/2ML IJ SOLN
INTRAMUSCULAR | Status: DC | PRN
  Administered 2013-07-01: 2 mg via INTRAVENOUS

## 2013-07-01 MED ORDER — PANTOPRAZOLE SODIUM 40 MG PO TBEC
40.0000 mg | DELAYED_RELEASE_TABLET | Freq: Once | ORAL | Status: AC
  Administered 2013-07-01: 40 mg via ORAL

## 2013-07-01 MED ORDER — LACTATED RINGERS IV SOLN
INTRAVENOUS | Status: DC
  Administered 2013-07-01 (×2): via INTRAVENOUS

## 2013-07-01 MED ORDER — LIDOCAINE HCL (CARDIAC) 20 MG/ML IV SOLN
INTRAVENOUS | Status: DC | PRN
  Administered 2013-07-01: 70 mg via INTRAVENOUS
  Administered 2013-07-01: 30 mg via INTRAVENOUS

## 2013-07-01 MED ORDER — PANTOPRAZOLE SODIUM 40 MG PO TBEC
DELAYED_RELEASE_TABLET | ORAL | Status: AC
  Administered 2013-07-01: 40 mg via ORAL
  Filled 2013-07-01: qty 1

## 2013-07-01 MED ORDER — FENTANYL CITRATE 0.05 MG/ML IJ SOLN: 25.0000 ug | INTRAMUSCULAR | Status: DC | PRN

## 2013-07-01 MED ORDER — NEOSTIGMINE METHYLSULFATE 10 MG/10ML IV SOLN
INTRAVENOUS | Status: DC | PRN
  Administered 2013-07-01: 3 mg via INTRAVENOUS

## 2013-07-01 MED ORDER — KETOROLAC TROMETHAMINE 30 MG/ML IJ SOLN
INTRAMUSCULAR | Status: AC
  Filled 2013-07-01: qty 1

## 2013-07-01 MED ORDER — LIDOCAINE HCL (CARDIAC) 20 MG/ML IV SOLN
INTRAVENOUS | Status: AC
  Filled 2013-07-01: qty 5

## 2013-07-01 MED ORDER — BUPIVACAINE HCL (PF) 0.25 % IJ SOLN
INTRAMUSCULAR | Status: DC | PRN
  Administered 2013-07-01: 13 mL

## 2013-07-01 MED ORDER — LACTATED RINGERS IR SOLN
Status: DC | PRN
  Administered 2013-07-01: 3000 mL

## 2013-07-01 SURGICAL SUPPLY — 34 items
ADH SKN CLS APL DERMABOND .7 (GAUZE/BANDAGES/DRESSINGS) ×2
BAG SPEC RTRVL LRG 6X4 10 (ENDOMECHANICALS)
BARRIER ADHS 3X4 INTERCEED (GAUZE/BANDAGES/DRESSINGS) IMPLANT
BRR ADH 4X3 ABS CNTRL BYND (GAUZE/BANDAGES/DRESSINGS)
CABLE HIGH FREQUENCY MONO STRZ (ELECTRODE) IMPLANT
CLOTH BEACON ORANGE TIMEOUT ST (SAFETY) ×3 IMPLANT
COVER MAYO STAND STRL (DRAPES) ×3 IMPLANT
DERMABOND ADVANCED (GAUZE/BANDAGES/DRESSINGS) ×1
DERMABOND ADVANCED .7 DNX12 (GAUZE/BANDAGES/DRESSINGS) IMPLANT
DRSG COVADERM PLUS 2X2 (GAUZE/BANDAGES/DRESSINGS) ×5 IMPLANT
DRSG OPSITE POSTOP 3X4 (GAUZE/BANDAGES/DRESSINGS) ×1 IMPLANT
DRSG TELFA 3X8 NADH (GAUZE/BANDAGES/DRESSINGS) ×3 IMPLANT
FILTER SMOKE EVAC LAPAROSHD (FILTER) ×3 IMPLANT
GLOVE BIOGEL PI IND STRL 8 (GLOVE) ×2 IMPLANT
GLOVE BIOGEL PI INDICATOR 8 (GLOVE) ×1
GLOVE ECLIPSE 7.5 STRL STRAW (GLOVE) ×6 IMPLANT
GOWN STRL REUS W/TWL LRG LVL3 (GOWN DISPOSABLE) ×6 IMPLANT
NS IRRIG 1000ML POUR BTL (IV SOLUTION) ×3 IMPLANT
PACK LAPAROSCOPY BASIN (CUSTOM PROCEDURE TRAY) ×3 IMPLANT
PAD DRESSING TELFA 3X8 NADH (GAUZE/BANDAGES/DRESSINGS) IMPLANT
POUCH SPECIMEN RETRIEVAL 10MM (ENDOMECHANICALS) IMPLANT
PROTECTOR NERVE ULNAR (MISCELLANEOUS) ×3 IMPLANT
SET IRRIG TUBING LAPAROSCOPIC (IRRIGATION / IRRIGATOR) ×3 IMPLANT
SHEARS HARMONIC ACE PLUS 36CM (ENDOMECHANICALS) ×1 IMPLANT
SOLUTION ELECTROLUBE (MISCELLANEOUS) IMPLANT
SUT VIC AB 3-0 PS2 18 (SUTURE) ×6
SUT VIC AB 3-0 PS2 18XBRD (SUTURE) ×2 IMPLANT
SUT VICRYL 0 UR6 27IN ABS (SUTURE) ×3 IMPLANT
TOWEL OR 17X24 6PK STRL BLUE (TOWEL DISPOSABLE) ×6 IMPLANT
TRAY FOLEY CATH 14FR (SET/KITS/TRAYS/PACK) ×3 IMPLANT
TROCAR XCEL NON-BLD 11X100MML (ENDOMECHANICALS) ×3 IMPLANT
TROCAR XCEL NON-BLD 5MMX100MML (ENDOMECHANICALS) ×3 IMPLANT
TROCAR XCEL OPT SLVE 5M 100M (ENDOMECHANICALS) ×3 IMPLANT
WARMER LAPAROSCOPE (MISCELLANEOUS) ×3 IMPLANT

## 2013-07-01 NOTE — Anesthesia Postprocedure Evaluation (Signed)
Anesthesia Post Note  Patient: Amanda Zuniga  Procedure(s) Performed: Procedure(s) (LRB): LAPAROSCOPY OPERATIVE/BILATERAL OVARIAN CYSTECTOMY (Bilateral) Fulguration of ENDOMETRIOSIS (N/A)  Anesthesia type: General  Patient location: PACU  Post pain: Pain level controlled  Post assessment: Post-op Vital signs reviewed  Last Vitals:  Filed Vitals:   07/01/13 0900  BP: 102/65  Pulse: 53  Temp:   Resp: 16    Post vital signs: Reviewed  Level of consciousness: sedated  Complications: No apparent anesthesia complications

## 2013-07-01 NOTE — Interval H&P Note (Signed)
History and Physical Interval Note:  07/01/2013 7:04 AM  Amanda Zuniga  has presented today for surgery, with the diagnosis of bilateral ovarian cysts  The various methods of treatment have been discussed with the patient and family. After consideration of risks, benefits and other options for treatment, the patient has consented to  Procedure(s) with comments: LAPAROSCOPY OPERATIVE/BILATERAL OVARIAN CYSTECTOMY (Bilateral) - bilateral ovarian cystectomy   as a surgical intervention .  The patient's history has been reviewed, patient examined, no change in status, stable for surgery.  I have reviewed the patient's chart and labs.  Questions were answered to the patient's satisfaction.     Terrance Mass

## 2013-07-01 NOTE — Transfer of Care (Signed)
Immediate Anesthesia Transfer of Care Note  Patient: Amanda Zuniga  Procedure(s) Performed: Procedure(s) with comments: LAPAROSCOPY OPERATIVE/BILATERAL OVARIAN CYSTECTOMY (Bilateral) - bilateral ovarian cystectomy   Fulguration of ENDOMETRIOSIS (N/A)  Patient Location: PACU  Anesthesia Type:General  Level of Consciousness: awake, alert , oriented and patient cooperative  Airway & Oxygen Therapy: Patient Spontanous Breathing and Patient connected to nasal cannula oxygen  Post-op Assessment: Report given to PACU RN and Post -op Vital signs reviewed and stable  Post vital signs: Reviewed and stable  Complications: No apparent anesthesia complications

## 2013-07-01 NOTE — Anesthesia Preprocedure Evaluation (Signed)
Anesthesia Evaluation  Patient identified by MRN, date of birth, ID band Patient awake    Reviewed: Allergy & Precautions, H&P , NPO status , Patient's Chart, lab work & pertinent test results, reviewed documented beta blocker date and time   History of Anesthesia Complications Negative for: history of anesthetic complications  Airway Mallampati: III TM Distance: >3 FB Neck ROM: full    Dental  (+) Teeth Intact   Pulmonary neg pulmonary ROS,  breath sounds clear to auscultation  Pulmonary exam normal       Cardiovascular Exercise Tolerance: Good negative cardio ROS  Rhythm:regular Rate:Normal     Neuro/Psych Anxiety negative neurological ROS     GI/Hepatic negative GI ROS, Neg liver ROS,   Endo/Other  negative endocrine ROSneg diabetes (h/o gestational)  Renal/GU negative Renal ROS  Female GU complaint     Musculoskeletal   Abdominal   Peds  Hematology negative hematology ROS (+)   Anesthesia Other Findings   Reproductive/Obstetrics negative OB ROS                           Anesthesia Physical Anesthesia Plan  ASA: II  Anesthesia Plan: General ETT   Post-op Pain Management:    Induction:   Airway Management Planned:   Additional Equipment:   Intra-op Plan:   Post-operative Plan:   Informed Consent: I have reviewed the patients History and Physical, chart, labs and discussed the procedure including the risks, benefits and alternatives for the proposed anesthesia with the patient or authorized representative who has indicated his/her understanding and acceptance.   Dental Advisory Given  Plan Discussed with: CRNA and Surgeon  Anesthesia Plan Comments:         Anesthesia Quick Evaluation

## 2013-07-01 NOTE — Discharge Instructions (Signed)
DISCHARGE INSTRUCTIONS: Laparoscopy  The following instructions have been prepared to help you care for yourself upon your return home today.  May take Ibuprofen after 2:20pm as needed for pain.  Wound care:  Do not get the incision wet for the first 24 hours. The incision should be kept clean and dry.  The Band-Aids or dressings may be removed the day after surgery.  Should the incision become sore, red, and swollen after the first week, check with your doctor.  Personal hygiene:  Shower the day after your procedure.  Activity and limitations:  Do NOT drive or operate any equipment today.  Do NOT lift anything more than 15 pounds for 2-3 weeks after surgery.  Do NOT rest in bed all day.  Walking is encouraged. Walk each day, starting slowly with 5-minute walks 3 or 4 times a day. Slowly increase the length of your walks.  Walk up and down stairs slowly.  Do NOT do strenuous activities, such as golfing, playing tennis, bowling, running, biking, weight lifting, gardening, mowing, or vacuuming for 2-4 weeks. Ask your doctor when it is okay to start.  Diet: Eat a light meal as desired this evening. You may resume your usual diet tomorrow.  Return to work: This is dependent on the type of work you do. For the most part you can return to a desk job within a week of surgery. If you are more active at work, please discuss this with your doctor.  What to expect after your surgery: You may have a slight burning sensation when you urinate on the first day. You may have a very small amount of blood in the urine. Expect to have a small amount of vaginal discharge/light bleeding for 1-2 weeks. It is not unusual to have abdominal soreness and bruising for up to 2 weeks. You may be tired and need more rest for about 1 week. You may experience shoulder pain for 24-72 hours. Lying flat in bed may relieve it.  Call your doctor for any of the following:  Develop a fever of 100.4 or greater   Inability to urinate 6 hours after discharge from hospital  Severe pain not relieved by pain medications  Persistent of heavy bleeding at incision site  Redness or swelling around incision site after a week  Increasing nausea or vomiting  Patient Signature:________________________________  Support Person Signature:_________________________  Nurse's Signature:________________________________

## 2013-07-01 NOTE — Op Note (Signed)
Operative Note  07/01/2013  9:01 AM  PATIENT:  Amanda Zuniga  48 y.o. female  PRE-OPERATIVE DIAGNOSIS:  bilateral ovarian cysts  POST-OPERATIVE DIAGNOSIS:  bilateral ovarian cysts; endometriosis  PROCEDURE:  Procedure(s): LAPAROSCOPY OPERATIVE/BILATERAL OVARIAN CYSTECTOMY Fulguration of ENDOMETRIOSIS  SURGEON:  Surgeon(s): Terrance Mass, MD Anastasio Auerbach, MD  ANESTHESIA:   general  FINDINGS: Bilateral ovarian cysts. External surfaces with no excrescences. Normal fallopian tubes bilateral. Normal uterus. Evidence of endometriosis was noted the left uterosacral ligament and anterior pelvic peritoneal surfaces. Appendix not visualized normal appearing liver surface.  DESCRIPTION OF OPERATION: The patient was taken to the operating room where she underwent a successful general endotracheal anesthesia. Patient had received 2 g of Cefotan IV for prophylaxis. She also had PSA stockings for DVT prophylaxis. A timeout was undertaken to properly identified the patient to discuss allowed the procedure to be undertaken. The abdomen vagina and perineum were prepped and draped in usual sterile fashion. A single-tooth tenaculum had been placed for manipulation of the uterus during the laparoscopic portion. A Foley catheter had also been inserted to monitor urinary output. A small subumbilical incision was made and a 10/11 mm trocar was inserted into the peritoneal cavity. A pneumoperitoneum was established for approximately 2-1/2 L of carbon dioxide. 2 additional 5 mm ports were made in the patient's right and left lower abdomen under laparoscopic visualization. The following was noted:  Bilateral ovarian cysts. External surfaces with no excrescences. Normal fallopian tubes bilateral. Normal uterus. Evidence of endometriosis was noted the left uterosacral ligament and anterior pelvic peritoneal surfaces. Appendix not visualized normal appearing liver surface.  Pelvic washings were obtained  and submitted for cytological evaluation. The right ovary was grasped at the area of the utero-ovarian ligament and with the harmonic scalpel blade the cyst that was incised and the cyst wall was removed and submitted for histological evaluation. With the use of the Kleppinger forcep the small bleeding vessels were cauterized and contained. Similar procedure was carried out on the contralateral side and both specimens were submitted separately for histological evaluation. Endometriotic implants nebulizer with the Kleppinger cautery device. Pelvic washings was once again performed. Pictures before and after procedure were obtained. The pneumoperitoneum was removed. The laparoscopic sheaths were removed. The subumbilical fascia was closed with a figure-of-eight of 0 Vicryl suture and the subcutaneous tissue was reapproximated with 3-0 Vicryl suture. The 5 mm port sites skin was reapproximated with Dermabond glue. For postoperative analgesia 0.25% Marcaine was infiltrated all 3 port sites approximately 15 cc. The Hulka tenaculum was removed the Foley catheter was removed. The patient was extubated transferred to recovery room stable vital signs. Patient received Toradol 30 mg IV in route to the recovery room. Sponge count and needle count were correct.   ESTIMATED BLOOD LOSS:minimal    Intake/Output Summary (Last 24 hours) at 07/01/13 0901 Last data filed at 07/01/13 0819  Gross per 24 hour  Intake   1500 ml  Output    225 ml  Net   1275 ml     BLOOD ADMINISTERED:none   LOCAL MEDICATIONS USED:  MARCAINE   0.25 % SQ 15 cc's  SPECIMEN:  Source of Specimen:  Bilateral Ovarian Cyst walls  DISPOSITION OF SPECIMEN:  PATHOLOGY  COUNTS:  YES  PLAN OF CARE: Transfer to [facility] AMTD@  Note: This dictation was prepared with  Dragon/digital dictation along withSmart phrase technology. Any transcriptional errors that result from this process are unintentional.

## 2013-07-02 ENCOUNTER — Encounter (HOSPITAL_COMMUNITY): Payer: Self-pay | Admitting: Gynecology

## 2013-07-02 MED FILL — Heparin Sodium (Porcine) Inj 5000 Unit/ML: INTRAMUSCULAR | Qty: 1 | Status: AC

## 2013-07-13 ENCOUNTER — Ambulatory Visit (INDEPENDENT_AMBULATORY_CARE_PROVIDER_SITE_OTHER): Payer: 59 | Admitting: Gastroenterology

## 2013-07-13 ENCOUNTER — Encounter: Payer: Self-pay | Admitting: Gastroenterology

## 2013-07-13 VITALS — BP 100/60 | HR 80 | Ht 65.0 in | Wt 148.0 lb

## 2013-07-13 DIAGNOSIS — K625 Hemorrhage of anus and rectum: Secondary | ICD-10-CM

## 2013-07-13 NOTE — Assessment & Plan Note (Signed)
2 episodes of limited rectal bleeding.  Symptoms for will daily due to hemorrhoids.  A more proximal colonic bleeding source from a polyp and, less likely, neoplasm should be ruled out.  Recommendations #1 colonoscopy

## 2013-07-13 NOTE — Patient Instructions (Signed)
It has been recommended to you by your physician that you have a(n) COLONOSCOPY completed. Per your request, we did not schedule the procedure(s) today. Please contact our office at [phone] should you decide to have the procedure completed.

## 2013-07-13 NOTE — Progress Notes (Signed)
_                                                                                                                History of Present Illness: Pleasant 48 year old white female referred for evaluation of rectal bleeding.  On 2 occasions in March, 2015 she had rectal bleeding consisting of bright red blood in the toilet water and on the tissue.  She denies rectal or abdominal pain.  There has been no change in bowel habits.  She's had no recurrences.  She's on no gastric irritants including nonsteroidals.    Past Medical History  Diagnosis Date  . NSVD (normal spontaneous vaginal delivery)     macrosomia  . CIN I (cervical intraepithelial neoplasia I)   . Ectopic pregnancy     HISTORY OF ECTOPIC/METHOTREXATE  . Diabetes mellitus     GESTATIONAL DIABETES  . H/O: vasectomy     HUSBAND HAS HAD VASECTOMY  . BCC (basal cell carcinoma of skin)     chest and back and right leg  . Anxiety    Past Surgical History  Procedure Laterality Date  . Cervical biopsy  w/ loop electrode excision    . Cesarean section  2004    33 weeks-hyprops, fetal tachycardia  . Laparoscopy Bilateral 07/01/2013    Procedure: LAPAROSCOPY OPERATIVE/BILATERAL OVARIAN CYSTECTOMY;  Surgeon: Terrance Mass, MD;  Location: [facility];  Service: Gynecology;  Laterality: Bilateral;  bilateral ovarian cystectomy   . Ablation on endometriosis N/A 07/01/2013    Procedure: Fulguration of ENDOMETRIOSIS;  Surgeon: Terrance Mass, MD;  Location: [facility];  Service: Gynecology;  Laterality: N/A;  . Dilation and curettage of uterus  1997    miscarriage   family history includes Bladder Cancer in her maternal grandmother and mother; Heart disease in her mother. Current Outpatient Prescriptions  Medication Sig Dispense Refill  . ALPRAZolam (XANAX) 0.25 MG tablet TAKE 1 TABLET AT BEDTIME AS NEEDED  30 tablet  2  . famotidine (PEPCID) 10 MG tablet Take 10 mg by mouth daily as needed for heartburn.      Marland Kitchen ibuprofen  (ADVIL,MOTRIN) 200 MG tablet Take 600-800 mg by mouth 2 (two) times daily as needed for headache.      . Multiple Vitamin (MULTIVITAMIN) tablet Take 1 tablet by mouth daily.       No current facility-administered medications for this visit.   Allergies as of 07/13/2013  . (No Known Allergies)    reports that she has never smoked. She has never used smokeless tobacco. She reports that she drinks alcohol. She reports that she does not use illicit drugs.     Review of Systems: She underwent laparoscopic surgery for removal of ovarian cysts approximately 2 weeks ago and complains of mild bloating.  Pertinent positive and negative review of systems were noted in the above HPI section. All other review of systems were otherwise negative.  Vital signs were reviewed in today's medical record Physical Exam: General: Well developed , well nourished, no acute  distress Skin: anicteric Head: Normocephalic and atraumatic Eyes:  sclerae anicteric, EOMI Ears: Normal auditory acuity Mouth: No deformity or lesions Neck: Supple, no masses or thyromegaly Lungs: Clear throughout to auscultation Heart: Regular rate and rhythm; no murmurs, rubs or bruits Abdomen: Soft, non tender and non distended. No masses, hepatosplenomegaly or hernias noted. Normal Bowel sounds Rectal: There are no external lesions Musculoskeletal: Symmetrical with no gross deformities  Skin: No lesions on visible extremities Pulses:  Normal pulses noted Extremities: No clubbing, cyanosis, edema or deformities noted Neurological: Alert oriented x 4, grossly nonfocal Cervical Nodes:  No significant cervical adenopathy Inguinal Nodes: No significant inguinal adenopathy Psychological:  Alert and cooperative. Normal mood and affect  See Assessment and Plan under Problem List

## 2013-07-16 ENCOUNTER — Encounter: Payer: Self-pay | Admitting: Gynecology

## 2013-07-16 ENCOUNTER — Ambulatory Visit (INDEPENDENT_AMBULATORY_CARE_PROVIDER_SITE_OTHER): Payer: 59 | Admitting: Gynecology

## 2013-07-16 VITALS — BP 110/70

## 2013-07-16 DIAGNOSIS — D270 Benign neoplasm of right ovary: Secondary | ICD-10-CM

## 2013-07-16 DIAGNOSIS — D279 Benign neoplasm of unspecified ovary: Secondary | ICD-10-CM

## 2013-07-16 DIAGNOSIS — Z09 Encounter for follow-up examination after completed treatment for conditions other than malignant neoplasm: Secondary | ICD-10-CM

## 2013-07-16 NOTE — Progress Notes (Signed)
   Patient presented to the office today for her two-week postoperative visit. The patient is status post laparoscopic bilateral ovarian cystectomy and fulguration of endometriosis. Pictures for surgery and findings and treatment as well as pathology report discussed with the patient as follows:  Findings: Bilateral ovarian cysts. External surfaces with no excrescences. Normal fallopian tubes bilateral. Normal uterus. Evidence of endometriosis was noted the left uterosacral ligament and anterior pelvic peritoneal surfaces. Appendix not visualized normal appearing liver surface.   Pathology report: 1. Ovary, cyst, right wall - OVARIAN TISSUE WITH HEMORRHAGIC LUTEINIZED FOLLICULAR CYST. NO ENDOMETRIOSIS OR MALIGNANCY. 2. 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 NO ENDOMETRIOSIS, BORDERLINE CHANGE OR EVIDENCE OF MALIGNANCY. 3. Ovary, cyst, left wall - OVARIAN TISSUE WITH PORTIONS OF CORPUS LUTEUM CYST. NO ENDOMETRIOSIS OR MALIGNANCY.  Exam: Abdomen: Soft nontender no rebound or guarding Port sites completely intact Pelvic exam: Bartholin urethra Skene glands: Within normal limits Vagina: Some small amount of menstrual blood present no lesion seen Cervix: No active bleeding Bimanual exam: Uterus anteverted normal size shape and consistency nontender Adnexa: No palpable mass or tenderness Rectal exam: Not done  Assessment/plan: Patient 2 weeks status post laparoscopic bilateral ovarian cystectomy and fulguration of endometriosis doing well. Benign pathology as described above. We will follow with you early ultrasounds because of the mucinous cyst adenoma although no borderline or malignant features were noted. Patient will resume her normal activity and we will see her back in one year or when necessary.

## 2013-07-19 ENCOUNTER — Other Ambulatory Visit: Payer: Self-pay | Admitting: Women's Health

## 2013-08-31 ENCOUNTER — Other Ambulatory Visit: Payer: Self-pay | Admitting: Gynecology

## 2013-08-31 NOTE — Telephone Encounter (Signed)
rx called in [facility] 

## 2013-10-06 ENCOUNTER — Other Ambulatory Visit: Payer: Self-pay | Admitting: Gynecology

## 2013-10-07 NOTE — Telephone Encounter (Signed)
Rx called in [facility] 

## 2013-10-13 ENCOUNTER — Encounter: Payer: Self-pay | Admitting: Gastroenterology

## 2013-10-14 ENCOUNTER — Other Ambulatory Visit: Payer: Self-pay | Admitting: Dermatology

## 2013-10-30 ENCOUNTER — Other Ambulatory Visit: Payer: Self-pay

## 2013-10-30 DIAGNOSIS — Z1231 Encounter for screening mammogram for malignant neoplasm of breast: Secondary | ICD-10-CM

## 2013-11-24 ENCOUNTER — Other Ambulatory Visit: Payer: Self-pay | Admitting: Gynecology

## 2013-11-24 NOTE — Telephone Encounter (Signed)
Called into pharmacy

## 2013-11-27 ENCOUNTER — Ambulatory Visit (AMBULATORY_SURGERY_CENTER): Payer: Self-pay | Admitting: *Deleted

## 2013-11-27 VITALS — Ht 65.5 in | Wt 151.0 lb

## 2013-11-27 DIAGNOSIS — K625 Hemorrhage of anus and rectum: Secondary | ICD-10-CM

## 2013-11-27 MED ORDER — NA SULFATE-K SULFATE-MG SULF 17.5-3.13-1.6 GM/177ML PO SOLN: 1.0000 | Freq: Once | ORAL | Status: DC

## 2013-11-27 NOTE — Progress Notes (Signed)
Denies allergies to eggs or soy products. Denies complications with sedation or anesthesia. Denies O2 use. Denies use of diet or weight loss medications.  Emmi instructions given for colonoscopy.  

## 2013-12-04 ENCOUNTER — Ambulatory Visit
Admission: RE | Admit: 2013-12-04 | Discharge: 2013-12-04 | Disposition: A | Discharge status: Home / Self Care | Payer: 59 | Source: Ambulatory Visit

## 2013-12-04 DIAGNOSIS — Z1231 Encounter for screening mammogram for malignant neoplasm of breast: Secondary | ICD-10-CM

## 2013-12-11 ENCOUNTER — Encounter: Payer: Self-pay | Admitting: Gastroenterology

## 2013-12-11 ENCOUNTER — Ambulatory Visit (AMBULATORY_SURGERY_CENTER): Payer: 59 | Admitting: Gastroenterology

## 2013-12-11 VITALS — BP 106/68 | HR 71 | Temp 97.3°F | Resp 34 | Ht 65.5 in | Wt 151.0 lb

## 2013-12-11 DIAGNOSIS — K625 Hemorrhage of anus and rectum: Secondary | ICD-10-CM

## 2013-12-11 MED ORDER — SODIUM CHLORIDE 0.9 % IV SOLN: 500.0000 mL | INTRAVENOUS | Status: DC

## 2013-12-11 NOTE — Op Note (Signed)
[place]  [address] [place], [address]   COLONOSCOPY PROCEDURE REPORT  PATIENT: Amanda, Zuniga  MR#: [id number] BIRTHDATE: Feb 11, 1965 , 48  yrs. old GENDER: female ENDOSCOPIST: Inda Castle, MD REFERRED BY: PROCEDURE DATE:  12/11/2013 PROCEDURE:   Colonoscopy, diagnostic First Screening Colonoscopy - Avg.  risk and is 50 yrs.  old or older Yes.  Prior Negative Screening - Now for repeat screening. N/A  History of Adenoma - Now for follow-up colonoscopy & has been > or = to 3 yrs.  N/A  Polyps Removed Today? No.  Recommend repeat exam, <10 yrs? No. ASA CLASS:   Class II INDICATIONS:anal bleeding. MEDICATIONS: Monitored anesthesia care and Propofol 230 mg IV  DESCRIPTION OF PROCEDURE:   After the risks benefits and alternatives of the procedure were thoroughly explained, informed consent was obtained.  The digital rectal exam revealed no abnormalities of the rectum.   The LB [id number]  endoscope was introduced through the anus and advanced to the cecum, which was identified by both the appendix and ileocecal valve. No adverse events experienced.   The quality of the prep was good, using MoviPrep  The instrument was then slowly withdrawn as the colon was fully examined.      COLON FINDINGS: A normal appearing cecum, ileocecal valve, and appendiceal orifice were identified.  The ascending, transverse, descending, sigmoid colon, and rectum appeared unremarkable. Retroflexed views revealed no abnormalities. The time to cecum=2 minutes 56 seconds.  Withdrawal time=6 minutes 31 seconds.  The scope was withdrawn and the procedure completed. COMPLICATIONS: There were no immediate complications.  ENDOSCOPIC IMPRESSION: Normal colonoscopy (limited rectal bleeding is secondary to hemorrhoids)  RECOMMENDATIONS: Continue current colorectal screening recommendations for "routine risk" patients with a repeat colonoscopy in 10 years.  eSigned:   Inda Castle, MD 12/11/2013 11:38 AM   cc:

## 2013-12-11 NOTE — Patient Instructions (Signed)
YOU HAD AN ENDOSCOPIC PROCEDURE TODAY AT THE [facility]: Refer to the procedure report that was given to you for any specific questions about what was found during the examination.  If the procedure report does not answer your questions, please call your gastroenterologist to clarify.  If you requested that your care partner not be given the details of your procedure findings, then the procedure report has been included in a sealed envelope for you to review at your convenience later.  YOU SHOULD EXPECT: Some feelings of bloating in the abdomen. Passage of more gas than usual.  Walking can help get rid of the air that was put into your GI tract during the procedure and reduce the bloating. If you had a lower endoscopy (such as a colonoscopy or flexible sigmoidoscopy) you may notice spotting of blood in your stool or on the toilet paper. If you underwent a bowel prep for your procedure, then you may not have a normal bowel movement for a few days.  DIET: Your first meal following the procedure should be a light meal and then it is ok to progress to your normal diet.  A half-sandwich or bowl of soup is an example of a good first meal.  Heavy or fried foods are harder to digest and may make you feel nauseous or bloated.  Likewise meals heavy in dairy and vegetables can cause extra gas to form and this can also increase the bloating.  Drink plenty of fluids but you should avoid alcoholic beverages for 24 hours.  ACTIVITY: Your care partner should take you home directly after the procedure.  You should plan to take it easy, moving slowly for the rest of the day.  You can resume normal activity the day after the procedure however you should NOT DRIVE or use heavy machinery for 24 hours (because of the sedation medicines used during the test).    SYMPTOMS TO REPORT IMMEDIATELY: A gastroenterologist can be reached at any hour.  During normal business hours, 8:30 AM to 5:00 PM Monday through Friday,  call [phone].  After hours and on weekends, please call the GI answering service at [phone] who will take a message and have the physician on call contact you.   Following lower endoscopy (colonoscopy or flexible sigmoidoscopy):  Excessive amounts of blood in the stool  Significant tenderness or worsening of abdominal pains  Swelling of the abdomen that is new, acute  Fever of 100F or higher  FOLLOW UP: If any biopsies were taken you will be contacted by phone or by letter within the next 1-3 weeks.  Call your gastroenterologist if you have not heard about the biopsies in 3 weeks.  Our staff will call the home number listed on your records the next business day following your procedure to check on you and address any questions or concerns that you may have at that time regarding the information given to you following your procedure. This is a courtesy call and so if there is no answer at the home number and we have not heard from you through the emergency physician on call, we will assume that you have returned to your regular daily activities without incident.  SIGNATURES/CONFIDENTIALITY: You and/or your care partner have signed paperwork which will be entered into your electronic medical record.  These signatures attest to the fact that that the information above on your After Visit Summary has been reviewed and is understood.  Full responsibility of the confidentiality of this   discharge information lies with you and/or your care-partner.  Recommendations Next colonoscopy in 10 years.

## 2013-12-11 NOTE — Progress Notes (Signed)
Report to PACU, RN, vss, BBS= Clear.  

## 2013-12-14 ENCOUNTER — Telehealth: Payer: Self-pay

## 2013-12-14 NOTE — Telephone Encounter (Signed)
Left message on answering machine. 

## 2013-12-22 ENCOUNTER — Other Ambulatory Visit: Payer: 59

## 2013-12-22 DIAGNOSIS — E7800 Pure hypercholesterolemia, unspecified: Secondary | ICD-10-CM

## 2013-12-22 LAB — LIPID PANEL
Cholesterol: 217 mg/dL — ABNORMAL HIGH (ref 0–200)
HDL: 53 mg/dL (ref >39)
LDL CALC: 136 mg/dL — AB (ref 0–99)
Total CHOL/HDL Ratio: 4.1 Ratio
Triglycerides: 141 mg/dL (ref <150)
VLDL: 28 mg/dL (ref 0–40)

## 2014-05-19 ENCOUNTER — Encounter: Payer: Self-pay | Admitting: Gynecology

## 2014-05-19 ENCOUNTER — Other Ambulatory Visit (HOSPITAL_COMMUNITY)
Admission: RE | Admit: 2014-05-19 | Discharge: 2014-05-19 | Disposition: A | Discharge status: Home / Self Care | Payer: 59 | Source: Ambulatory Visit | Attending: Gynecology | Admitting: Gynecology

## 2014-05-19 ENCOUNTER — Ambulatory Visit (INDEPENDENT_AMBULATORY_CARE_PROVIDER_SITE_OTHER): Payer: 59 | Admitting: Gynecology

## 2014-05-19 VITALS — BP 110/70 | Ht 65.5 in | Wt 152.0 lb

## 2014-05-19 DIAGNOSIS — Z01419 Encounter for gynecological examination (general) (routine) without abnormal findings: Secondary | ICD-10-CM

## 2014-05-19 DIAGNOSIS — Z1151 Encounter for screening for human papillomavirus (HPV): Secondary | ICD-10-CM | POA: Insufficient documentation

## 2014-05-19 DIAGNOSIS — N951 Menopausal and female climacteric states: Secondary | ICD-10-CM

## 2014-05-19 LAB — CBC WITH DIFFERENTIAL/PLATELET
Basophils Absolute: 0 10*3/uL (ref 0.0–0.1)
Basophils Relative: 0 % (ref 0–1)
Eosinophils Absolute: 0 10*3/uL (ref 0.0–0.7)
Eosinophils Relative: 0 % (ref 0–5)
HCT: 43 % (ref 36.0–46.0)
Hemoglobin: 14.3 g/dL (ref 12.0–15.0)
Lymphocytes Relative: 25 % (ref 12–46)
Lymphs Abs: 2.5 10*3/uL (ref 0.7–4.0)
MCH: 29.9 pg (ref 26.0–34.0)
MCHC: 33.3 g/dL (ref 30.0–36.0)
MCV: 90 fL (ref 78.0–100.0)
MPV: 10.1 fL (ref 8.6–12.4)
Monocytes Absolute: 0.6 10*3/uL (ref 0.1–1.0)
Monocytes Relative: 6 % (ref 3–12)
Neutro Abs: 6.8 10*3/uL (ref 1.7–7.7)
Neutrophils Relative %: 69 % (ref 43–77)
Platelets: 378 10*3/uL (ref 150–400)
RBC: 4.78 MIL/uL (ref 3.87–5.11)
RDW: 12.9 % (ref 11.5–15.5)
WBC: 9.8 10*3/uL (ref 4.0–10.5)

## 2014-05-19 NOTE — Patient Instructions (Signed)
Hormone Therapy At menopause, your body begins making less estrogen and progesterone hormones. This causes the body to stop having menstrual periods. This is because estrogen and progesterone hormones control your periods and menstrual cycle. A lack of estrogen may cause symptoms such as:  Hot flushes (or hot flashes).  Vaginal dryness.  Dry skin.  Loss of sex drive.  Risk of bone loss (osteoporosis). When this happens, you may choose to take hormone therapy to get back the estrogen lost during menopause. When the hormone estrogen is given alone, it is usually referred to as ET (Estrogen Therapy). When the hormone progestin is combined with estrogen, it is generally called HT (Hormone Therapy). This was formerly known as hormone replacement therapy (HRT). Your caregiver can help you make a decision on what will be best for you. The decision to use HT seems to change often as new studies are done. Many studies do not agree on the benefits of hormone replacement therapy. LIKELY BENEFITS OF HT INCLUDE PROTECTION FROM:  Hot Flushes (also called hot flashes) - A hot flush is a sudden feeling of heat that spreads over the face and body. The skin may redden like a blush. It is connected with sweats and sleep disturbance. Women going through menopause may have hot flushes a few times a month or several times per day depending on the woman.  Osteoporosis (bone loss)- Estrogen helps guard against bone loss. After menopause, a woman's bones slowly lose calcium and become weak and brittle. As a result, bones are more likely to break. The hip, wrist, and spine are affected most often. Hormone therapy can help slow bone loss after menopause. Weight bearing exercise and taking calcium with vitamin D also can help prevent bone loss. There are also medications that your caregiver can prescribe that can help prevent osteoporosis.  Vaginal Dryness - Loss of estrogen causes changes in the vagina. Its lining may  become thin and dry. These changes can cause pain and bleeding during sexual intercourse. Dryness can also lead to infections. This can cause burning and itching. (Vaginal estrogen treatment can help relieve pain, itching, and dryness.)  Urinary Tract Infections are more common after menopause because of lack of estrogen. Some women also develop urinary incontinence because of low estrogen levels in the vagina and bladder.  Possible other benefits of estrogen include a positive effect on mood and short-term memory in women. RISKS AND COMPLICATIONS  Using estrogen alone without progesterone causes the lining of the uterus to grow. This increases the risk of lining of the uterus (endometrial) cancer. Your caregiver should give another hormone called progestin if you have a uterus.  Women who take combined (estrogen and progestin) HT appear to have an increased risk of breast cancer. The risk appears to be small, but increases throughout the time that HT is taken.  Combined therapy also makes the breast tissue slightly denser which makes it harder to read mammograms (breast X-rays).  Combined, estrogen and progesterone therapy can be taken together every day, in which case there may be spotting of blood. HT therapy can be taken cyclically in which case you will have menstrual periods. Cyclically means HT is taken for a set amount of days, then not taken, then this process is repeated.  HT may increase the risk of stroke, heart attack, breast cancer and forming blood clots in your leg.  Transdermal estrogen (estrogen that is absorbed through the skin with a patch or a cream) may have more positive results with:    Cholesterol.  Blood pressure.  Blood clots. Having the following conditions may indicate you should not have HT:  Endometrial cancer.  Liver disease.  Breast cancer.  Heart disease.  History of blood clots.  Stroke. TREATMENT   If you choose to take HT and have a uterus,  usually estrogen and progestin are prescribed.  Your caregiver will help you decide the best way to take the medications.  Possible ways to take estrogen include:  Pills.  Patches.  Gels.  Sprays.  Vaginal estrogen cream, rings and tablets.  It is best to take the lowest dose possible that will help your symptoms and take them for the shortest period of time that you can.  Hormone therapy can help relieve some of the problems (symptoms) that affect women at menopause. Before making a decision about HT, talk to your caregiver about what is best for you. Be well informed and comfortable with your decisions. HOME CARE INSTRUCTIONS   Follow your caregivers advice when taking the medications.  A Pap test is done to screen for cervical cancer.  The first Pap test should be done at age 21.  Between ages 21 and 29, Pap tests are repeated every 2 years.  Beginning at age 30, you are advised to have a Pap test every 3 years as long as your past 3 Pap tests have been normal.  Some women have medical problems that increase the chance of getting cervical cancer. Talk to your caregiver about these problems. It is especially important to talk to your caregiver if a new problem develops soon after your last Pap test. In these cases, your caregiver may recommend more frequent screening and Pap tests.  The above recommendations are the same for women who have or have not gotten the vaccine for HPV (Human Papillomavirus).  If you had a hysterectomy for a problem that was not a cancer or a condition that could lead to cancer, then you no longer need Pap tests. However, even if you no longer need a Pap test, a regular exam is a good idea to make sure no other problems are starting.   If you are between ages 65 and 70, and you have had normal Pap tests going back 10 years, you no longer need Pap tests. However, even if you no longer need a Pap test, a regular exam is a good idea to make sure no  other problems are starting.   If you have had past treatment for cervical cancer or a condition that could lead to cancer, you need Pap tests and screening for cancer for at least 20 years after your treatment.  If Pap tests have been discontinued, risk factors (such as a new sexual partner) need to be re-assessed to determine if screening should be resumed.  Some women may need screenings more often if they are at high risk for cervical cancer.  Get mammograms done as per the advice of your caregiver. SEEK IMMEDIATE MEDICAL CARE IF:  You develop abnormal vaginal bleeding.  You have pain or swelling in your legs, shortness of breath, or chest pain.  You develop dizziness or headaches.  You have lumps or changes in your breasts or armpits.  You have slurred speech.  You develop weakness or numbness of your arms or legs.  You have pain, burning, or bleeding when urinating.  You develop abdominal pain. Document Released: 10/14/2002 Document Revised: 04/09/2011 Document Reviewed: 02/01/2010 [employer] Patient Information 2015 [employer]. This information is not intended to   replace advice given to you by your health care provider. Make sure you discuss any questions you have with your health care provider. Perimenopause Perimenopause is the time when your body begins to move into the menopause (no menstrual period for 12 straight months). It is a natural process. Perimenopause can begin 2-8 years before the menopause and usually lasts for 1 year after the menopause. During this time, your ovaries may or may not produce an egg. The ovaries vary in their production of estrogen and progesterone hormones each month. This can cause irregular menstrual periods, difficulty getting pregnant, vaginal bleeding between periods, and uncomfortable symptoms. CAUSES  Irregular production of the ovarian hormones, estrogen and progesterone, and not ovulating every month.  Other causes  include:  Tumor of the pituitary gland in the brain.  Medical disease that affects the ovaries.  Radiation treatment.  Chemotherapy.  Unknown causes.  Heavy smoking and excessive alcohol intake can bring on perimenopause sooner. SIGNS AND SYMPTOMS   Hot flashes.  Night sweats.  Irregular menstrual periods.  Decreased sex drive.  Vaginal dryness.  Headaches.  Mood swings.  Depression.  Memory problems.  Irritability.  Tiredness.  Weight gain.  Trouble getting pregnant.  The beginning of losing bone cells (osteoporosis).  The beginning of hardening of the arteries (atherosclerosis). DIAGNOSIS  Your health care provider will make a diagnosis by analyzing your age, menstrual history, and symptoms. He or she will do a physical exam and note any changes in your body, especially your female organs. Female hormone tests may or may not be helpful depending on the amount of female hormones you produce and when you produce them. However, other hormone tests may be helpful to rule out other problems. TREATMENT  In some cases, no treatment is needed. The decision on whether treatment is necessary during the perimenopause should be made by you and your health care provider based on how the symptoms are affecting you and your lifestyle. Various treatments are available, such as:  Treating individual symptoms with a specific medicine for that symptom.  Herbal medicines that can help specific symptoms.  Counseling.  Group therapy. HOME CARE INSTRUCTIONS   Keep track of your menstrual periods (when they occur, how heavy they are, how long between periods, and how long they last) as well as your symptoms and when they started.  Only take over-the-counter or prescription medicines as directed by your health care provider.  Sleep and rest.  Exercise.  Eat a diet that contains calcium (good for your bones) and soy (acts like the estrogen hormone).  Do not smoke.  Avoid  alcoholic beverages.  Take vitamin supplements as recommended by your health care provider. Taking vitamin E may help in certain cases.  Take calcium and vitamin D supplements to help prevent bone loss.  Group therapy is sometimes helpful.  Acupuncture may help in some cases. SEEK MEDICAL CARE IF:   You have questions about any symptoms you are having.  You need a referral to a specialist (gynecologist, psychiatrist, or psychologist). SEEK IMMEDIATE MEDICAL CARE IF:   You have vaginal bleeding.  Your period lasts longer than 8 days.  Your periods are recurring sooner than 21 days.  You have bleeding after intercourse.  You have severe depression.  You have pain when you urinate.  You have severe headaches.  You have vision problems. Document Released: 02/23/2004 Document Revised: 11/05/2012 Document Reviewed: 08/14/2012 [facility] Patient Information 2015 [place], [place]. This information is not intended to replace advice given to  to you by your health care provider. Make sure you discuss any questions you have with your health care provider.  

## 2014-05-19 NOTE — Progress Notes (Signed)
Amanda Zuniga 1965/11/11 [id number]   History:    49 y.o.  for annual gyn exam who presented to the office today also with a complaint of hot flashes, irritability, mood swing, weight gain and last year had skipped several cycles. 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 She is currently being followed by Dr. 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 In 2015 she had a colonoscopy as a result of hematochezia turned out to be internal hemorrhoids no polyps were identified. Patient with personal history of gestational diabetes with her first pregnancy and also strong family history of thyroid disease. In 2014 patient had an endometrial biopsy as a result of dysfunction uterine bleeding and pathology report was benign.  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 Followup Pap smears have been normal.   Past medical history,surgical history, family history and social history were all reviewed and documented in the EPIC chart.  Gynecologic History Patient's last menstrual period was 05/05/2014. Contraception: Vasectomy Last Pap:  2013. Results were: normal Last mammogram:  2015. Results were: normal  Obstetric History OB History  Gravida Para Term Preterm AB SAB TAB Ectopic Multiple Living  4 2 1 1 2 1  1  2     # Outcome Date GA Lbr Len/2nd Weight Sex Delivery Anes PTL Lv  4 Ectopic           3 SAB           2 Preterm     M CS-Unspec  Y Y  1 Term     M Vag-Spont  N Y       ROS: A ROS was performed and pertinent positives and negatives are included in the history.  GENERAL: No fevers or chills. HEENT: No change in vision, no earache, sore throat or sinus congestion. NECK: No pain or stiffness. CARDIOVASCULAR: No chest pain or  pressure. No palpitations. PULMONARY: No shortness of breath, cough or wheeze. GASTROINTESTINAL: No abdominal pain, nausea, vomiting or diarrhea, melena or bright red blood per rectum. GENITOURINARY: No urinary frequency, urgency, hesitancy or dysuria. MUSCULOSKELETAL: No joint or muscle pain, no back pain, no recent trauma. DERMATOLOGIC: No rash, no itching, no lesions. ENDOCRINE: No polyuria, polydipsia, no heat or cold intolerance. No recent change in weight. HEMATOLOGICAL: No anemia or easy bruising or bleeding. NEUROLOGIC: No headache, seizures, numbness, tingling or weakness. PSYCHIATRIC: No depression, no loss of interest in normal activity or change in sleep pattern.     Exam: chaperone present  BP 110/70 mmHg  Ht 5' 5.5" (1.664 m)  Wt 152 lb (68.947 kg)  BMI 24.90 kg/m2  LMP 05/05/2014  Body mass index is 24.9 kg/(m^2).  General appearance : Well developed well nourished female. No acute distress HEENT: Eyes: no retinal hemorrhage or exudates,  Neck supple, trachea midline, no carotid bruits, no thyroidmegaly Lungs: Clear to auscultation, no rhonchi or wheezes, or rib retractions  Heart: Regular rate and rhythm, no murmurs or gallops Breast:Examined in sitting and supine position were symmetrical in appearance, no palpable masses or tenderness,  no skin retraction, no nipple inversion, no nipple discharge, no skin discoloration, no axillary or supraclavicular lymphadenopathy Abdomen: no palpable masses or tenderness, no rebound or guarding Extremities: no edema or skin discoloration or tenderness  Pelvic:  Bartholin, Urethra, Skene Glands: Within normal limits             Vagina: No gross lesions or discharge  Cervix: No gross lesions or discharge  Uterus  anteverted, normal size, shape and consistency, non-tender and mobile  Adnexa  Without masses or tenderness  Anus and perineum  normal   Rectovaginal  normal sphincter tone without palpated masses or tenderness              Hemoccult  not indicated     Assessment/Plan:  49 y.o. female for annual exam  with perimenopausal signs and symptoms. For this reason an [facility] will be drawn today. Because of her weight gain we'll be checking her TSH. Also as part of her annual screening lab work the following labs were ordered: Comprehensive metabolic panel, fasting lipid profile, CBC, and urinalysis. Pap smear was done today. Patient was provided with literature information on the perimenopause and on hormone replacement therapy we'll wait for results and manage accordingly. Patient is reminded do her monthly breast exam. Patient scheduled for mammogram later this year her by she will continue to have three-dimensional mammogram because of her dense breasts. We also discussed importance of calcium vitamin D and regular exercise for osteoporosis prevention.   Terrance Mass MD, 9:34 AM 05/19/2014

## 2014-05-20 ENCOUNTER — Telehealth: Payer: Self-pay

## 2014-05-20 ENCOUNTER — Other Ambulatory Visit: Payer: Self-pay | Admitting: Gynecology

## 2014-05-20 DIAGNOSIS — R799 Abnormal finding of blood chemistry, unspecified: Secondary | ICD-10-CM

## 2014-05-20 LAB — URINALYSIS W MICROSCOPIC + REFLEX CULTURE
BACTERIA UA: NONE SEEN
BILIRUBIN URINE: NEGATIVE
CASTS: NONE SEEN
CRYSTALS: NONE SEEN
Glucose, UA: NEGATIVE mg/dL
Hgb urine dipstick: NEGATIVE
Ketones, ur: NEGATIVE mg/dL
Nitrite: NEGATIVE
PH: 5.5 (ref 5.0–8.0)
PROTEIN: NEGATIVE mg/dL
Specific Gravity, Urine: 1.024 (ref 1.005–1.030)
UROBILINOGEN UA: 0.2 mg/dL (ref 0.0–1.0)

## 2014-05-20 LAB — LIPID PANEL
CHOLESTEROL: 224 mg/dL — AB (ref 0–200)
HDL: 58 mg/dL (ref >=46)
LDL Cholesterol: 139 mg/dL — ABNORMAL HIGH (ref 0–99)
Total CHOL/HDL Ratio: 3.9 Ratio
Triglycerides: 133 mg/dL (ref <150)
VLDL: 27 mg/dL (ref 0–40)

## 2014-05-20 LAB — FOLLICLE STIMULATING HORMONE: FSH: 75.6 m[IU]/mL

## 2014-05-20 LAB — COMPREHENSIVE METABOLIC PANEL
ALT: 17 U/L (ref 0–35)
AST: 18 U/L (ref 0–37)
Albumin: 4.4 g/dL (ref 3.5–5.2)
Alkaline Phosphatase: 92 U/L (ref 39–117)
BUN: 24 mg/dL — AB (ref 6–23)
CALCIUM: 9.3 mg/dL (ref 8.4–10.5)
CHLORIDE: 106 meq/L (ref 96–112)
CO2: 23 mEq/L (ref 19–32)
Creat: 0.75 mg/dL (ref 0.50–1.10)
GLUCOSE: 85 mg/dL (ref 70–99)
POTASSIUM: 4.6 meq/L (ref 3.5–5.3)
Sodium: 142 mEq/L (ref 135–145)
Total Bilirubin: 0.3 mg/dL (ref 0.2–1.2)
Total Protein: 7.4 g/dL (ref 6.0–8.3)

## 2014-05-20 LAB — CYTOLOGY - PAP

## 2014-05-20 LAB — TSH: TSH: 1.428 u[IU]/mL (ref 0.350–4.500)

## 2014-05-20 NOTE — Telephone Encounter (Signed)
Patient inquired about her [place] level. She had seen it on My Chart. She said that was the main thing she came to see you about.   Your note read "History: 49 y.o. for annual gyn exam who presented to the office today also with a complaint of hot flashes, irritability, mood swing, weight gain and last year had skipped several cycles."  Your note also mentioned you were going to provide her with literature on perimenopause and HRT.  She said she did not get that at visit but she would like to have that to read as she knows very little about it.

## 2014-05-20 NOTE — Telephone Encounter (Signed)
-----   Message from Terrance Mass, MD sent at 05/20/2014  8:07 AM EDT ----- Please inform patient that her lipid profile is indicative that her total cholesterol is slightly elevated more than 4 months ago as well as her LDL cholesterol. I believe she needs to be placed on a low dose statin that she can come by the office for [place] to talk about or that she should begin seemed in a primary care physician who may want to consider starting her on it although it may be questionable since her HDL could cholesterol is above normal which is a good sign. She should continue to exercise on a regular basis. We also need to repeat 1 blood test next week her conference metabolic panel indicated that her BUN which is part of kidney function tests slightly above normal which could be indicative of not being well hydrated. Encourage good fluid intake and we'll check the BUN next week she does not need to be fasting. 4 months ago was completely normal.

## 2014-05-21 LAB — URINE CULTURE
COLONY COUNT: NO GROWTH
Organism ID, Bacteria: NO GROWTH

## 2014-05-21 NOTE — Telephone Encounter (Signed)
Please tell patient that her [place] is in the menopausal range. I need to see her in consultation to discuss treatment options. From epic you can download and send her Menopause and Hormone Replacement Therapy info so she can start to read before visit. Also she can visit [facility] ([place]) website for additional info.

## 2014-05-21 NOTE — Telephone Encounter (Signed)
Left message I am mailing literature to her.  I recommended she schedule office visit to come discuss treatment options with Dr. Moshe Salisbury.

## 2014-05-26 ENCOUNTER — Other Ambulatory Visit: Payer: 59

## 2014-05-26 DIAGNOSIS — R799 Abnormal finding of blood chemistry, unspecified: Secondary | ICD-10-CM

## 2014-05-26 LAB — BUN: BUN: 22 mg/dL (ref 6–23)

## 2014-06-02 ENCOUNTER — Encounter: Payer: Self-pay | Admitting: Gynecology

## 2014-06-02 ENCOUNTER — Ambulatory Visit (INDEPENDENT_AMBULATORY_CARE_PROVIDER_SITE_OTHER): Payer: 59 | Admitting: Gynecology

## 2014-06-02 VITALS — BP 124/80 | Ht 66.0 in | Wt 151.0 lb

## 2014-06-02 DIAGNOSIS — G4700 Insomnia, unspecified: Secondary | ICD-10-CM | POA: Diagnosis not present

## 2014-06-02 DIAGNOSIS — N951 Menopausal and female climacteric states: Secondary | ICD-10-CM | POA: Diagnosis not present

## 2014-06-02 DIAGNOSIS — R6882 Decreased libido: Secondary | ICD-10-CM | POA: Diagnosis not present

## 2014-06-02 DIAGNOSIS — R4586 Emotional lability: Secondary | ICD-10-CM

## 2014-06-02 DIAGNOSIS — Z79890 Hormone replacement therapy (postmenopausal): Secondary | ICD-10-CM | POA: Diagnosis not present

## 2014-06-02 DIAGNOSIS — F39 Unspecified mood [affective] disorder: Secondary | ICD-10-CM

## 2014-06-02 DIAGNOSIS — E349 Endocrine disorder, unspecified: Secondary | ICD-10-CM

## 2014-06-02 DIAGNOSIS — E785 Hyperlipidemia, unspecified: Secondary | ICD-10-CM | POA: Diagnosis not present

## 2014-06-02 MED ORDER — FLUVASTATIN SODIUM ER 80 MG PO TB24: ORAL_TABLET | ORAL | Status: DC

## 2014-06-02 MED ORDER — ESTRADIOL 1 MG PO TABS: 1.0000 mg | ORAL_TABLET | Freq: Every day | ORAL | Status: DC

## 2014-06-02 MED ORDER — PROGESTERONE MICRONIZED 200 MG PO CAPS: 200.0000 mg | ORAL_CAPSULE | Freq: Every day | ORAL | Status: DC

## 2014-06-02 NOTE — Patient Instructions (Signed)
Patient information: High cholesterol (The Basics)  What is cholesterol? - Cholesterol is a substance that is found in the blood. Everyone has some. It is needed for good health. The problem is, people sometimes have too much cholesterol. Compared with people with normal cholesterol, people with high cholesterol have a higher risk of heart attacks, strokes, and other health problems. The higher your cholesterol, the higher your risk of these problems. Cholesterol levels in your body are determined significantly by your diet. Cholesterol levels may also be related to heart disease. The following material helps to explain this relationship and discusses what you can do to help keep your heart healthy. Not all cholesterol is bad. Low-density lipoprotein (LDL) cholesterol is the "bad" cholesterol. It may cause fatty deposits to build up inside your arteries. High-density lipoprotein (HDL) cholesterol is "good." It helps to remove the "bad" LDL cholesterol from your blood. Cholesterol is a very important risk factor for heart disease. Other risk factors are high blood pressure, smoking, stress, heredity, and weight.  The heart muscle gets its supply of blood through the coronary arteries. If your LDL cholesterol is high and your HDL cholesterol is low, you are at risk for having fatty deposits build up in your coronary arteries. This leaves less room through which blood can flow. Without sufficient blood and oxygen, the heart muscle cannot function properly and you may feel chest pains (angina pectoris). When a coronary artery closes up entirely, a part of the heart muscle may die, causing a heart attack (myocardial infarction).  CHECKING CHOLESTEROL When your caregiver sends your blood to a lab to be analyzed for cholesterol, a complete lipid (fat) profile may be done. With this test, the total amount of cholesterol and levels of LDL and HDL are determined. Triglycerides  are a type of fat that circulates in the blood and can also be used to determine heart disease risk. Are there different types of cholesterol? - Yes, there are a few different types. If you get a cholesterol test, you may hear your doctor or nurse talk about: Total cholesterol  LDL cholesterol - Some people call this the "bad" cholesterol. That's because having high LDL levels raises your risk of heart attacks, strokes, and other health problems.  HDL cholesterol - Some people call this the "good" cholesterol. That's because having high HDL levels lowers your risk of heart attacks, strokes, and other health problems.  Non-HDL cholesterol - Non-HDL cholesterol is your total cholesterol minus your HDL cholesterol.  Triglycerides - Triglycerides are not cholesterol. They are a type of fat. But they often get measured when cholesterol is measured. (Having high triglycerides also seems to increase the risk of heart attacks and strokes.)   Keep in mind, though, that many people who cannot meet these goals still have a low risk of heart attacks and strokes. What should I do if my doctor tells me I have high cholesterol? - Ask your doctor what your overall risk of heart attacks and strokes is. High cholesterol, by itself, is not always a reason to worry. Having high cholesterol is just one of many things that can increase your risk of heart attacks and strokes. Other factors that increase your risk include:  Cigarette smoking  High blood pressure  Having a parent, sister, or brother who got heart disease at a young age (Young, in this case, means younger than 55 for men and younger than 65 for women.)  Being a man (Women are at risk, too, but men   have a higher risk.)  Older age  If you are at high risk of heart attacks and strokes, having high cholesterol is a problem. On the other hand, if you have are at low risk, having high cholesterol may not mean much. Should I take medicine to lower cholesterol? - Not  everyone who has high cholesterol needs medicines. Your doctor or nurse will decide if you need them based on your age, family history, and other health concerns.  You should probably take a cholesterol-lowering medicine called a statin if you: Already had a heart attack or stroke  Have known heart disease  Have diabetes  Have a condition called peripheral artery disease, which makes it painful to walk, and happens when the arteries in your legs get clogged with fatty deposits  Have an abdominal aortic aneurysm, which is a widening of the main artery in the belly  Most people with any of the conditions listed above should take a statin no matter what their cholesterol level is. If your doctor or nurse puts you on a statin, stay on it. The medicine may not make you feel any different. But it can help prevent heart attacks, strokes, and death.  Can I lower my cholesterol without medicines? - Yes, you can lower your cholesterol some by:  Avoiding red meat, butter, fried foods, cheese, and other foods that have a lot of saturated fat  Losing weight (if you are overweight)  Being more active Even if these steps do little to change your cholesterol, they can improve your health in many ways.                                                   Cholesterol Control Diet  CONTROLLING CHOLESTEROL WITH DIET Although exercise and lifestyle factors are important, your diet is key. That is because certain foods are known to raise cholesterol and others to lower it. The goal is to balance foods for their effect on cholesterol and more importantly, to replace saturated and trans fat with other types of fat, such as monounsaturated fat, polyunsaturated fat, and omega-3 fatty acids. On average, a person should consume no more than 15 to 17 g of saturated fat daily. Saturated and trans fats are considered "bad" fats, and they will raise LDL cholesterol. Saturated fats are primarily found in animal products such as  meats, butter, and cream. However, that does not mean you need to sacrifice all your favorite foods. Today, there are good tasting, low-fat, low-cholesterol substitutes for most of the things you like to eat. Choose low-fat or nonfat alternatives. Choose round or loin cuts of red meat, since these types of cuts are lowest in fat and cholesterol. Chicken (without the skin), fish, veal, and ground turkey breast are excellent choices. Eliminate fatty meats, such as hot dogs and salami. Even shellfish have little or no saturated fat. Have a 3 oz (85 g) portion when you eat lean meat, poultry, or fish. Trans fats are also called "partially hydrogenated oils." They are oils that have been scientifically manipulated so that they are solid at room temperature resulting in a longer shelf life and improved taste and texture of foods in which they are added. Trans fats are found in stick margarine, some tub margarines, cookies, crackers, and baked goods.  When baking and cooking, oils are an excellent substitute   for butter. The monounsaturated oils are especially beneficial since it is believed they lower LDL and raise HDL. The oils you should avoid entirely are saturated tropical oils, such as coconut and palm.  Remember to eat liberally from food groups that are naturally free of saturated and trans fat, including fish, fruit, vegetables, beans, grains (barley, rice, couscous, bulgur wheat), and pasta (without cream sauces).  IDENTIFYING FOODS THAT LOWER CHOLESTEROL  Soluble fiber may lower your cholesterol. This type of fiber is found in fruits such as apples, vegetables such as broccoli, potatoes, and carrots, legumes such as beans, peas, and lentils, and grains such as barley. Foods fortified with plant sterols (phytosterol) may also lower cholesterol. You should eat at least 2 g per day of these foods for a cholesterol lowering effect.  Read package labels to identify low-saturated fats, trans fats free, and  low-fat foods at the supermarket. Select cheeses that have only 2 to 3 g saturated fat per ounce. Use a heart-healthy tub margarine that is free of trans fats or partially hydrogenated oil. When buying baked goods (cookies, crackers), avoid partially hydrogenated oils. Breads and muffins should be made from whole grains (whole-wheat or whole oat flour, instead of "flour" or "enriched flour"). Buy non-creamy canned soups with reduced salt and no added fats.  FOOD PREPARATION TECHNIQUES  Never deep-fry. If you must fry, either stir-fry, which uses very little fat, or use non-stick cooking sprays. When possible, broil, bake, or roast meats, and steam vegetables. Instead of dressing vegetables with butter or margarine, use lemon and herbs, applesauce and cinnamon (for squash and sweet potatoes), nonfat yogurt, salsa, and low-fat dressings for salads.  LOW-SATURATED FAT / LOW-FAT FOOD SUBSTITUTES Meats / Saturated Fat (g)  Avoid: Steak, marbled (3 oz/85 g) / 11 g   Choose: Steak, lean (3 oz/85 g) / 4 g   Avoid: Hamburger (3 oz/85 g) / 7 g   Choose: Hamburger, lean (3 oz/85 g) / 5 g   Avoid: Ham (3 oz/85 g) / 6 g   Choose: Ham, lean cut (3 oz/85 g) / 2.4 g   Avoid: Chicken, with skin, dark meat (3 oz/85 g) / 4 g   Choose: Chicken, skin removed, dark meat (3 oz/85 g) / 2 g   Avoid: Chicken, with skin, light meat (3 oz/85 g) / 2.5 g   Choose: Chicken, skin removed, light meat (3 oz/85 g) / 1 g  Dairy / Saturated Fat (g)  Avoid: Whole milk (1 cup) / 5 g   Choose: Low-fat milk, 2% (1 cup) / 3 g   Choose: Low-fat milk, 1% (1 cup) / 1.5 g   Choose: Skim milk (1 cup) / 0.3 g   Avoid: Hard cheese (1 oz/28 g) / 6 g   Choose: Skim milk cheese (1 oz/28 g) / 2 to 3 g   Avoid: Cottage cheese, 4% fat (1 cup) / 6.5 g   Choose: Low-fat cottage cheese, 1% fat (1 cup) / 1.5 g   Avoid: Ice cream (1 cup) / 9 g   Choose: Sherbet (1 cup) / 2.5 g   Choose: Nonfat frozen yogurt (1 cup) / 0.3 g    Choose: Frozen fruit bar / trace   Avoid: Whipped cream (1 tbs) / 3.5 g   Choose: Nondairy whipped topping (1 tbs) / 1 g  Condiments / Saturated Fat (g)  Avoid: Mayonnaise (1 tbs) / 2 g   Choose: Low-fat mayonnaise (1 tbs) / 1 g   Avoid:   Butter (1 tbs) / 7 g   Choose: Extra light margarine (1 tbs) / 1 g   Avoid: Coconut oil (1 tbs) / 11.8 g   Choose: Olive oil (1 tbs) / 1.8 g   Choose: Corn oil (1 tbs) / 1.7 g   Choose: Safflower oil (1 tbs) / 1.2 g   Choose: Sunflower oil (1 tbs) / 1.4 g   Choose: Soybean oil (1 tbs) / 2.4 g   Choose: Canola oil (1 tbs) / 1 g                                            Patient information: High cholesterol (The Basics)  What is cholesterol? - Cholesterol is a substance that is found in the blood. Everyone has some. It is needed for good health. The problem is, people sometimes have too much cholesterol. Compared with people with normal cholesterol, people with high cholesterol have a higher risk of heart attacks, strokes, and other health problems. The higher your cholesterol, the higher your risk of these problems. Cholesterol levels in your body are determined significantly by your diet. Cholesterol levels may also be related to heart disease. The following material helps to explain this relationship and discusses what you can do to help keep your heart healthy. Not all cholesterol is bad. Low-density lipoprotein (LDL) cholesterol is the "bad" cholesterol. It may cause fatty deposits to build up inside your arteries. High-density lipoprotein (HDL) cholesterol is "good." It helps to remove the "bad" LDL cholesterol from your blood. Cholesterol is a very important risk factor for heart disease. Other risk factors are high blood pressure, smoking, stress, heredity, and weight.  The heart muscle gets its supply of blood through the coronary arteries. If your LDL cholesterol is high and your HDL cholesterol is low, you are at risk for having fatty  deposits build up in your coronary arteries. This leaves less room through which blood can flow. Without sufficient blood and oxygen, the heart muscle cannot function properly and you may feel chest pains (angina pectoris). When a coronary artery closes up entirely, a part of the heart muscle may die, causing a heart attack (myocardial infarction).  CHECKING CHOLESTEROL When your caregiver sends your blood to a lab to be analyzed for cholesterol, a complete lipid (fat) profile may be done. With this test, the total amount of cholesterol and levels of LDL and HDL are determined. Triglycerides are a type of fat that circulates in the blood and can also be used to determine heart disease risk. Are there different types of cholesterol? - Yes, there are a few different types. If you get a cholesterol test, you may hear your doctor or nurse talk about: Total cholesterol  LDL cholesterol - Some people call this the "bad" cholesterol. That's because having high LDL levels raises your risk of heart attacks, strokes, and other health problems.  HDL cholesterol - Some people call this the "good" cholesterol. That's because having high HDL levels lowers your risk of heart attacks, strokes, and other health problems.  Non-HDL cholesterol - Non-HDL cholesterol is your total cholesterol minus your HDL cholesterol.  Triglycerides - Triglycerides are not cholesterol. They are a type of fat. But they often get measured when cholesterol is measured. (Having high triglycerides also seems to increase the risk of heart attacks and strokes.)   Keep in mind, though, that  many people who cannot meet these goals still have a low risk of heart attacks and strokes. What should I do if my doctor tells me I have high cholesterol? - Ask your doctor what your overall risk of heart attacks and strokes is. High cholesterol, by itself, is not always a reason to worry. Having high cholesterol is just one of many things that can increase  your risk of heart attacks and strokes. Other factors that increase your risk include:  Cigarette smoking  High blood pressure  Having a parent, sister, or brother who got heart disease at a young age ([place], in this case, means younger than 27 for men and younger than 67 for women.)  Being a man (Women are at risk, too, but men have a higher risk.)  Older age  If you are at high risk of heart attacks and strokes, having high cholesterol is a problem. On the other hand, if you have are at low risk, having high cholesterol may not mean much. Should I take medicine to lower cholesterol? - Not everyone who has high cholesterol needs medicines. Your doctor or nurse will decide if you need them based on your age, family history, and other health concerns.  You should probably take a cholesterol-lowering medicine called a statin if you: Already had a heart attack or stroke  Have known heart disease  Have diabetes  Have a condition called peripheral artery disease, which makes it painful to walk, and happens when the arteries in your legs get clogged with fatty deposits  Have an abdominal aortic aneurysm, which is a widening of the main artery in the belly  Most people with any of the conditions listed above should take a statin no matter what their cholesterol level is. If your doctor or nurse puts you on a statin, stay on it. The medicine may not make you feel any different. But it can help prevent heart attacks, strokes, and death.  Can I lower my cholesterol without medicines? - Yes, you can lower your cholesterol some by:  Avoiding red meat, butter, fried foods, cheese, and other foods that have a lot of saturated fat  Losing weight (if you are overweight)  Being more active Even if these steps do little to change your cholesterol, they can improve your health in many ways.                                                   Cholesterol Control Diet  CONTROLLING CHOLESTEROL WITH DIET Although  exercise and lifestyle factors are important, your diet is key. That is because certain foods are known to raise cholesterol and others to lower it. The goal is to balance foods for their effect on cholesterol and more importantly, to replace saturated and trans fat with other types of fat, such as monounsaturated fat, polyunsaturated fat, and omega-3 fatty acids. On average, a person should consume no more than 15 to 17 g of saturated fat daily. Saturated and trans fats are considered "bad" fats, and they will raise LDL cholesterol. Saturated fats are primarily found in animal products such as meats, butter, and cream. However, that does not mean you need to sacrifice all your favorite foods. Today, there are good tasting, low-fat, low-cholesterol substitutes for most of the things you like to eat. Choose low-fat or nonfat alternatives. Choose  round or loin cuts of red meat, since these types of cuts are lowest in fat and cholesterol. Chicken (without the skin), fish, veal, and ground [place] breast are excellent choices. Eliminate fatty meats, such as hot dogs and salami. Even shellfish have little or no saturated fat. Have a 3 oz (85 g) portion when you eat lean meat, poultry, or fish. Trans fats are also called "partially hydrogenated oils." They are oils that have been scientifically manipulated so that they are solid at room temperature resulting in a longer shelf life and improved taste and texture of foods in which they are added. Trans fats are found in stick margarine, some tub margarines, cookies, crackers, and baked goods.  When baking and cooking, oils are an excellent substitute for butter. The monounsaturated oils are especially beneficial since it is believed they lower LDL and raise HDL. The oils you should avoid entirely are saturated tropical oils, such as coconut and palm.  Remember to eat liberally from food groups that are naturally free of saturated and trans fat, including fish, fruit,  vegetables, beans, grains (barley, rice, couscous, bulgur wheat), and pasta (without cream sauces).  IDENTIFYING FOODS THAT LOWER CHOLESTEROL  Soluble fiber may lower your cholesterol. This type of fiber is found in fruits such as apples, vegetables such as broccoli, potatoes, and carrots, legumes such as beans, peas, and lentils, and grains such as barley. Foods fortified with plant sterols (phytosterol) may also lower cholesterol. You should eat at least 2 g per day of these foods for a cholesterol lowering effect.  Read package labels to identify low-saturated fats, trans fats free, and low-fat foods at the supermarket. Select cheeses that have only 2 to 3 g saturated fat per ounce. Use a heart-healthy tub margarine that is free of trans fats or partially hydrogenated oil. When buying baked goods (cookies, crackers), avoid partially hydrogenated oils. Breads and muffins should be made from whole grains (whole-wheat or whole oat flour, instead of "flour" or "enriched flour"). Buy non-creamy canned soups with reduced salt and no added fats.  FOOD PREPARATION TECHNIQUES  Never deep-fry. If you must fry, either stir-fry, which uses very little fat, or use non-stick cooking sprays. When possible, broil, bake, or roast meats, and steam vegetables. Instead of dressing vegetables with butter or margarine, use lemon and herbs, applesauce and cinnamon (for squash and sweet potatoes), nonfat yogurt, salsa, and low-fat dressings for salads.  LOW-SATURATED FAT / LOW-FAT FOOD SUBSTITUTES Meats / Saturated Fat (g) Avoid: Steak, marbled (3 oz/85 g) / 11 g  Choose: Steak, lean (3 oz/85 g) / 4 g  Avoid: Hamburger (3 oz/85 g) / 7 g  Choose: Hamburger, lean (3 oz/85 g) / 5 g  Avoid: Ham (3 oz/85 g) / 6 g  Choose: Ham, lean cut (3 oz/85 g) / 2.4 g  Avoid: Chicken, with skin, dark meat (3 oz/85 g) / 4 g  Choose: Chicken, skin removed, dark meat (3 oz/85 g) / 2 g  Avoid: Chicken, with skin, light meat (3 oz/85 g) / 2.5  g  Choose: Chicken, skin removed, light meat (3 oz/85 g) / 1 g  Dairy / Saturated Fat (g) Avoid: Whole milk (1 cup) / 5 g  Choose: Low-fat milk, 2% (1 cup) / 3 g  Choose: Low-fat milk, 1% (1 cup) / 1.5 g  Choose: Skim milk (1 cup) / 0.3 g  Avoid: Hard cheese (1 oz/28 g) / 6 g  Choose: Skim milk cheese (1 oz/28 g) / 2  to 3 g  Avoid: Cottage cheese, 4% fat (1 cup) / 6.5 g  Choose: Low-fat cottage cheese, 1% fat (1 cup) / 1.5 g  Avoid: Ice cream (1 cup) / 9 g  Choose: Sherbet (1 cup) / 2.5 g  Choose: Nonfat frozen yogurt (1 cup) / 0.3 g  Choose: Frozen fruit bar / trace  Avoid: Whipped cream (1 tbs) / 3.5 g  Choose: Nondairy whipped topping (1 tbs) / 1 g  Condiments / Saturated Fat (g) Avoid: Mayonnaise (1 tbs) / 2 g  Choose: Low-fat mayonnaise (1 tbs) / 1 g  Avoid: Butter (1 tbs) / 7 g  Choose: Extra light margarine (1 tbs) / 1 g  Avoid: Coconut oil (1 tbs) / 11.8 g  Choose: Olive oil (1 tbs) / 1.8 g  Choose: Corn oil (1 tbs) / 1.7 g  Choose: Safflower oil (1 tbs) / 1.2 g  Choose: Sunflower oil (1 tbs) / 1.4 g  Choose: Soybean oil (1 tbs) / 2.4 g  Choose: Canola oil (1 tbs) / 1 g                                            Patient information: High cholesterol (The Basics)  What is cholesterol? - Cholesterol is a substance that is found in the blood. Everyone has some. It is needed for good health. The problem is, people sometimes have too much cholesterol. Compared with people with normal cholesterol, people with high cholesterol have a higher risk of heart attacks, strokes, and other health problems. The higher your cholesterol, the higher your risk of these problems. Cholesterol levels in your body are determined significantly by your diet. Cholesterol levels may also be related to heart disease. The following material helps to explain this relationship and discusses what you can do to help keep your heart healthy. Not all cholesterol is bad. Low-density lipoprotein (LDL) cholesterol  is the "bad" cholesterol. It may cause fatty deposits to build up inside your arteries. High-density lipoprotein (HDL) cholesterol is "good." It helps to remove the "bad" LDL cholesterol from your blood. Cholesterol is a very important risk factor for heart disease. Other risk factors are high blood pressure, smoking, stress, heredity, and weight.  The heart muscle gets its supply of blood through the coronary arteries. If your LDL cholesterol is high and your HDL cholesterol is low, you are at risk for having fatty deposits build up in your coronary arteries. This leaves less room through which blood can flow. Without sufficient blood and oxygen, the heart muscle cannot function properly and you may feel chest pains (angina pectoris). When a coronary artery closes up entirely, a part of the heart muscle may die, causing a heart attack (myocardial infarction).  CHECKING CHOLESTEROL When your caregiver sends your blood to a lab to be analyzed for cholesterol, a complete lipid (fat) profile may be done. With this test, the total amount of cholesterol and levels of LDL and HDL are determined. Triglycerides are a type of fat that circulates in the blood and can also be used to determine heart disease risk. Are there different types of cholesterol? - Yes, there are a few different types. If you get a cholesterol test, you may hear your doctor or nurse talk about: Total cholesterol  LDL cholesterol - Some people call this the "bad" cholesterol. That's because having high LDL levels raises  your risk of heart attacks, strokes, and other health problems.  HDL cholesterol - Some people call this the "good" cholesterol. That's because having high HDL levels lowers your risk of heart attacks, strokes, and other health problems.  Non-HDL cholesterol - Non-HDL cholesterol is your total cholesterol minus your HDL cholesterol.  Triglycerides - Triglycerides are not cholesterol. They are a type of fat. But they often get  measured when cholesterol is measured. (Having high triglycerides also seems to increase the risk of heart attacks and strokes.)   Keep in mind, though, that many people who cannot meet these goals still have a low risk of heart attacks and strokes. What should I do if my doctor tells me I have high cholesterol? - Ask your doctor what your overall risk of heart attacks and strokes is. High cholesterol, by itself, is not always a reason to worry. Having high cholesterol is just one of many things that can increase your risk of heart attacks and strokes. Other factors that increase your risk include:  Cigarette smoking  High blood pressure  Having a parent, sister, or brother who got heart disease at a young age ([place], in this case, means younger than 72 for men and younger than 53 for women.)  Being a man (Women are at risk, too, but men have a higher risk.)  Older age  If you are at high risk of heart attacks and strokes, having high cholesterol is a problem. On the other hand, if you have are at low risk, having high cholesterol may not mean much. Should I take medicine to lower cholesterol? - Not everyone who has high cholesterol needs medicines. Your doctor or nurse will decide if you need them based on your age, family history, and other health concerns.  You should probably take a cholesterol-lowering medicine called a statin if you: Already had a heart attack or stroke  Have known heart disease  Have diabetes  Have a condition called peripheral artery disease, which makes it painful to walk, and happens when the arteries in your legs get clogged with fatty deposits  Have an abdominal aortic aneurysm, which is a widening of the main artery in the belly  Most people with any of the conditions listed above should take a statin no matter what their cholesterol level is. If your doctor or nurse puts you on a statin, stay on it. The medicine may not make you feel any different. But it can help  prevent heart attacks, strokes, and death.  Can I lower my cholesterol without medicines? - Yes, you can lower your cholesterol some by:  Avoiding red meat, butter, fried foods, cheese, and other foods that have a lot of saturated fat  Losing weight (if you are overweight)  Being more active Even if these steps do little to change your cholesterol, they can improve your health in many ways.                                                   Cholesterol Control Diet  CONTROLLING CHOLESTEROL WITH DIET Although exercise and lifestyle factors are important, your diet is key. That is because certain foods are known to raise cholesterol and others to lower it. The goal is to balance foods for their effect on cholesterol and more importantly, to replace saturated and trans fat with  other types of fat, such as monounsaturated fat, polyunsaturated fat, and omega-3 fatty acids. On average, a person should consume no more than 15 to 17 g of saturated fat daily. Saturated and trans fats are considered "bad" fats, and they will raise LDL cholesterol. Saturated fats are primarily found in animal products such as meats, butter, and cream. However, that does not mean you need to sacrifice all your favorite foods. Today, there are good tasting, low-fat, low-cholesterol substitutes for most of the things you like to eat. Choose low-fat or nonfat alternatives. Choose round or loin cuts of red meat, since these types of cuts are lowest in fat and cholesterol. Chicken (without the skin), fish, veal, and ground [place] breast are excellent choices. Eliminate fatty meats, such as hot dogs and salami. Even shellfish have little or no saturated fat. Have a 3 oz (85 g) portion when you eat lean meat, poultry, or fish. Trans fats are also called "partially hydrogenated oils." They are oils that have been scientifically manipulated so that they are solid at room temperature resulting in a longer shelf life and improved taste and  texture of foods in which they are added. Trans fats are found in stick margarine, some tub margarines, cookies, crackers, and baked goods.  When baking and cooking, oils are an excellent substitute for butter. The monounsaturated oils are especially beneficial since it is believed they lower LDL and raise HDL. The oils you should avoid entirely are saturated tropical oils, such as coconut and palm.  Remember to eat liberally from food groups that are naturally free of saturated and trans fat, including fish, fruit, vegetables, beans, grains (barley, rice, couscous, bulgur wheat), and pasta (without cream sauces).  IDENTIFYING FOODS THAT LOWER CHOLESTEROL  Soluble fiber may lower your cholesterol. This type of fiber is found in fruits such as apples, vegetables such as broccoli, potatoes, and carrots, legumes such as beans, peas, and lentils, and grains such as barley. Foods fortified with plant sterols (phytosterol) may also lower cholesterol. You should eat at least 2 g per day of these foods for a cholesterol lowering effect.  Read package labels to identify low-saturated fats, trans fats free, and low-fat foods at the supermarket. Select cheeses that have only 2 to 3 g saturated fat per ounce. Use a heart-healthy tub margarine that is free of trans fats or partially hydrogenated oil. When buying baked goods (cookies, crackers), avoid partially hydrogenated oils. Breads and muffins should be made from whole grains (whole-wheat or whole oat flour, instead of "flour" or "enriched flour"). Buy non-creamy canned soups with reduced salt and no added fats.  FOOD PREPARATION TECHNIQUES  Never deep-fry. If you must fry, either stir-fry, which uses very little fat, or use non-stick cooking sprays. When possible, broil, bake, or roast meats, and steam vegetables. Instead of dressing vegetables with butter or margarine, use lemon and herbs, applesauce and cinnamon (for squash and sweet potatoes), nonfat yogurt,  salsa, and low-fat dressings for salads.  LOW-SATURATED FAT / LOW-FAT FOOD SUBSTITUTES Meats / Saturated Fat (g) Avoid: Steak, marbled (3 oz/85 g) / 11 g  Choose: Steak, lean (3 oz/85 g) / 4 g  Avoid: Hamburger (3 oz/85 g) / 7 g  Choose: Hamburger, lean (3 oz/85 g) / 5 g  Avoid: Ham (3 oz/85 g) / 6 g  Choose: Ham, lean cut (3 oz/85 g) / 2.4 g  Avoid: Chicken, with skin, dark meat (3 oz/85 g) / 4 g  Choose: Chicken, skin removed, dark meat (  3 oz/85 g) / 2 g  Avoid: Chicken, with skin, light meat (3 oz/85 g) / 2.5 g  Choose: Chicken, skin removed, light meat (3 oz/85 g) / 1 g  Dairy / Saturated Fat (g) Avoid: Whole milk (1 cup) / 5 g  Choose: Low-fat milk, 2% (1 cup) / 3 g  Choose: Low-fat milk, 1% (1 cup) / 1.5 g  Choose: Skim milk (1 cup) / 0.3 g  Avoid: Hard cheese (1 oz/28 g) / 6 g  Choose: Skim milk cheese (1 oz/28 g) / 2 to 3 g  Avoid: Cottage cheese, 4% fat (1 cup) / 6.5 g  Choose: Low-fat cottage cheese, 1% fat (1 cup) / 1.5 g  Avoid: Ice cream (1 cup) / 9 g  Choose: Sherbet (1 cup) / 2.5 g  Choose: Nonfat frozen yogurt (1 cup) / 0.3 g  Choose: Frozen fruit bar / trace  Avoid: Whipped cream (1 tbs) / 3.5 g  Choose: Nondairy whipped topping (1 tbs) / 1 g  Condiments / Saturated Fat (g) Avoid: Mayonnaise (1 tbs) / 2 g  Choose: Low-fat mayonnaise (1 tbs) / 1 g  Avoid: Butter (1 tbs) / 7 g  Choose: Extra light margarine (1 tbs) / 1 g  Avoid: Coconut oil (1 tbs) / 11.8 g  Choose: Olive oil (1 tbs) / 1.8 g  Choose: Corn oil (1 tbs) / 1.7 g  Choose: Safflower oil (1 tbs) / 1.2 g  Choose: Sunflower oil (1 tbs) / 1.4 g  Choose: Soybean oil (1 tbs) / 2.4 g  Choose: Canola oil (1 tbs) / 1 g                                            Patient information: High cholesterol (The Basics)  What is cholesterol? - Cholesterol is a substance that is found in the blood. Everyone has some. It is needed for good health. The problem is, people sometimes have too much cholesterol. Compared  with people with normal cholesterol, people with high cholesterol have a higher risk of heart attacks, strokes, and other health problems. The higher your cholesterol, the higher your risk of these problems. Cholesterol levels in your body are determined significantly by your diet. Cholesterol levels may also be related to heart disease. The following material helps to explain this relationship and discusses what you can do to help keep your heart healthy. Not all cholesterol is bad. Low-density lipoprotein (LDL) cholesterol is the "bad" cholesterol. It may cause fatty deposits to build up inside your arteries. High-density lipoprotein (HDL) cholesterol is "good." It helps to remove the "bad" LDL cholesterol from your blood. Cholesterol is a very important risk factor for heart disease. Other risk factors are high blood pressure, smoking, stress, heredity, and weight.  The heart muscle gets its supply of blood through the coronary arteries. If your LDL cholesterol is high and your HDL cholesterol is low, you are at risk for having fatty deposits build up in your coronary arteries. This leaves less room through which blood can flow. Without sufficient blood and oxygen, the heart muscle cannot function properly and you may feel chest pains (angina pectoris). When a coronary artery closes up entirely, a part of the heart muscle may die, causing a heart attack (myocardial infarction).  CHECKING CHOLESTEROL When your caregiver sends your blood to a lab to be analyzed for  cholesterol, a complete lipid (fat) profile may be done. With this test, the total amount of cholesterol and levels of LDL and HDL are determined. Triglycerides are a type of fat that circulates in the blood and can also be used to determine heart disease risk. Are there different types of cholesterol? - Yes, there are a few different types. If you get a cholesterol test, you may hear your doctor or nurse talk about: Total cholesterol  LDL  cholesterol - Some people call this the "bad" cholesterol. That's because having high LDL levels raises your risk of heart attacks, strokes, and other health problems.  HDL cholesterol - Some people call this the "good" cholesterol. That's because having high HDL levels lowers your risk of heart attacks, strokes, and other health problems.  Non-HDL cholesterol - Non-HDL cholesterol is your total cholesterol minus your HDL cholesterol.  Triglycerides - Triglycerides are not cholesterol. They are a type of fat. But they often get measured when cholesterol is measured. (Having high triglycerides also seems to increase the risk of heart attacks and strokes.)   Keep in mind, though, that many people who cannot meet these goals still have a low risk of heart attacks and strokes. What should I do if my doctor tells me I have high cholesterol? - Ask your doctor what your overall risk of heart attacks and strokes is. High cholesterol, by itself, is not always a reason to worry. Having high cholesterol is just one of many things that can increase your risk of heart attacks and strokes. Other factors that increase your risk include:  Cigarette smoking  High blood pressure  Having a parent, sister, or brother who got heart disease at a young age ([place], in this case, means younger than 75 for men and younger than 33 for women.)  Being a man (Women are at risk, too, but men have a higher risk.)  Older age  If you are at high risk of heart attacks and strokes, having high cholesterol is a problem. On the other hand, if you have are at low risk, having high cholesterol may not mean much. Should I take medicine to lower cholesterol? - Not everyone who has high cholesterol needs medicines. Your doctor or nurse will decide if you need them based on your age, family history, and other health concerns.  You should probably take a cholesterol-lowering medicine called a statin if you: Already had a heart attack or stroke   Have known heart disease  Have diabetes  Have a condition called peripheral artery disease, which makes it painful to walk, and happens when the arteries in your legs get clogged with fatty deposits  Have an abdominal aortic aneurysm, which is a widening of the main artery in the belly  Most people with any of the conditions listed above should take a statin no matter what their cholesterol level is. If your doctor or nurse puts you on a statin, stay on it. The medicine may not make you feel any different. But it can help prevent heart attacks, strokes, and death.  Can I lower my cholesterol without medicines? - Yes, you can lower your cholesterol some by:  Avoiding red meat, butter, fried foods, cheese, and other foods that have a lot of saturated fat  Losing weight (if you are overweight)  Being more active Even if these steps do little to change your cholesterol, they can improve your health in many ways.  Cholesterol Control Diet  CONTROLLING CHOLESTEROL WITH DIET Although exercise and lifestyle factors are important, your diet is key. That is because certain foods are known to raise cholesterol and others to lower it. The goal is to balance foods for their effect on cholesterol and more importantly, to replace saturated and trans fat with other types of fat, such as monounsaturated fat, polyunsaturated fat, and omega-3 fatty acids. On average, a person should consume no more than 15 to 17 g of saturated fat daily. Saturated and trans fats are considered "bad" fats, and they will raise LDL cholesterol. Saturated fats are primarily found in animal products such as meats, butter, and cream. However, that does not mean you need to sacrifice all your favorite foods. Today, there are good tasting, low-fat, low-cholesterol substitutes for most of the things you like to eat. Choose low-fat or nonfat alternatives. Choose round or loin cuts of red meat,  since these types of cuts are lowest in fat and cholesterol. Chicken (without the skin), fish, veal, and ground [place] breast are excellent choices. Eliminate fatty meats, such as hot dogs and salami. Even shellfish have little or no saturated fat. Have a 3 oz (85 g) portion when you eat lean meat, poultry, or fish. Trans fats are also called "partially hydrogenated oils." They are oils that have been scientifically manipulated so that they are solid at room temperature resulting in a longer shelf life and improved taste and texture of foods in which they are added. Trans fats are found in stick margarine, some tub margarines, cookies, crackers, and baked goods.  When baking and cooking, oils are an excellent substitute for butter. The monounsaturated oils are especially beneficial since it is believed they lower LDL and raise HDL. The oils you should avoid entirely are saturated tropical oils, such as coconut and palm.  Remember to eat liberally from food groups that are naturally free of saturated and trans fat, including fish, fruit, vegetables, beans, grains (barley, rice, couscous, bulgur wheat), and pasta (without cream sauces).  IDENTIFYING FOODS THAT LOWER CHOLESTEROL  Soluble fiber may lower your cholesterol. This type of fiber is found in fruits such as apples, vegetables such as broccoli, potatoes, and carrots, legumes such as beans, peas, and lentils, and grains such as barley. Foods fortified with plant sterols (phytosterol) may also lower cholesterol. You should eat at least 2 g per day of these foods for a cholesterol lowering effect.  Read package labels to identify low-saturated fats, trans fats free, and low-fat foods at the supermarket. Select cheeses that have only 2 to 3 g saturated fat per ounce. Use a heart-healthy tub margarine that is free of trans fats or partially hydrogenated oil. When buying baked goods (cookies, crackers), avoid partially hydrogenated oils. Breads and muffins  should be made from whole grains (whole-wheat or whole oat flour, instead of "flour" or "enriched flour"). Buy non-creamy canned soups with reduced salt and no added fats.  FOOD PREPARATION TECHNIQUES  Never deep-fry. If you must fry, either stir-fry, which uses very little fat, or use non-stick cooking sprays. When possible, broil, bake, or roast meats, and steam vegetables. Instead of dressing vegetables with butter or margarine, use lemon and herbs, applesauce and cinnamon (for squash and sweet potatoes), nonfat yogurt, salsa, and low-fat dressings for salads.  LOW-SATURATED FAT / LOW-FAT FOOD SUBSTITUTES Meats / Saturated Fat (g) Avoid: Steak, marbled (3 oz/85 g) / 11 g  Choose: Steak, lean (3 oz/85 g) / 4 g  Avoid: Hamburger (3  oz/85 g) / 7 g  Choose: Hamburger, lean (3 oz/85 g) / 5 g  Avoid: Ham (3 oz/85 g) / 6 g  Choose: Ham, lean cut (3 oz/85 g) / 2.4 g  Avoid: Chicken, with skin, dark meat (3 oz/85 g) / 4 g  Choose: Chicken, skin removed, dark meat (3 oz/85 g) / 2 g  Avoid: Chicken, with skin, light meat (3 oz/85 g) / 2.5 g  Choose: Chicken, skin removed, light meat (3 oz/85 g) / 1 g  Dairy / Saturated Fat (g) Avoid: Whole milk (1 cup) / 5 g  Choose: Low-fat milk, 2% (1 cup) / 3 g  Choose: Low-fat milk, 1% (1 cup) / 1.5 g  Choose: Skim milk (1 cup) / 0.3 g  Avoid: Hard cheese (1 oz/28 g) / 6 g  Choose: Skim milk cheese (1 oz/28 g) / 2 to 3 g  Avoid: Cottage cheese, 4% fat (1 cup) / 6.5 g  Choose: Low-fat cottage cheese, 1% fat (1 cup) / 1.5 g  Avoid: Ice cream (1 cup) / 9 g  Choose: Sherbet (1 cup) / 2.5 g  Choose: Nonfat frozen yogurt (1 cup) / 0.3 g  Choose: Frozen fruit bar / trace  Avoid: Whipped cream (1 tbs) / 3.5 g  Choose: Nondairy whipped topping (1 tbs) / 1 g  Condiments / Saturated Fat (g) Avoid: Mayonnaise (1 tbs) / 2 g  Choose: Low-fat mayonnaise (1 tbs) / 1 g  Avoid: Butter (1 tbs) / 7 g  Choose: Extra light margarine (1 tbs) / 1 g  Avoid: Coconut oil (1 tbs)  / 11.8 g  Choose: Olive oil (1 tbs) / 1.8 g  Choose: Corn oil (1 tbs) / 1.7 g  Choose: Safflower oil (1 tbs) / 1.2 g  Choose: Sunflower oil (1 tbs) / 1.4 g  Choose: Soybean oil (1 tbs) / 2.4 g  Choose: Canola oil (1 tbs) / 1 g  Progesterone capsules What is this medicine? PROGESTERONE (proe JES ter one) is a female hormone. This medicine is used to prevent the overgrowth of the lining of the uterus in women who are taking estrogens for the symptoms of menopause. It is also used to treat secondary amenorrhea. This is when a woman stops getting menstrual periods due to low levels of progesterone. This medicine may be used for other purposes; ask your health care provider or pharmacist if you have questions. COMMON BRAND NAME(S): Prometrium What should I tell my health care provider before I take this medicine? They need to know if you have any of these conditions: -autoimmune disease like systemic lupus erythematosus (SLE) -blood vessel disease, blood clotting disorder, or suffered a stroke -breast, cervical or vaginal cancer -dementia -diabetes -kidney or liver disease -heart disease, high blood pressure or recent heart attack -high blood lipids or cholesterol -hysterectomy -recent miscarriage -tobacco smoker -vaginal bleeding -an unusual or allergic reaction to progesterone, peanuts, other medicines, foods, dyes, or preservatives -pregnant or trying to get pregnant -breast-feeding How should I use this medicine? Take this medicine by mouth with a glass of water. Follow the directions on the prescription label. Take your doses at regular intervals. Do not take your medicine more often than directed. Talk to your pediatrician regarding the use of this medicine in children. Special care may be needed. A patient package insert for the product will be given with each prescription and refill. Read this sheet carefully each time. The sheet may change frequently. Overdosage: If you think you  have taken too much of this medicine contact a poison control center or emergency room at once. NOTE: This medicine is only for you. Do not share this medicine with others. What if I miss a dose? If you miss a dose, take it as soon as you can. If it is almost time for your next dose, take only that dose. Do not take double or extra doses. What may interact with this medicine? Do not take this medicine with any of the following medications: -bosentan This medicine may also interact with the following medications: -barbiturate medicines for sleep or seizures -bexarotene -carbamazepine -ethotoin -ketoconazole -phenytoin -rifampin This list may not describe all possible interactions. Give your health care provider a list of all the medicines, herbs, non-prescription drugs, or dietary supplements you use. Also tell them if you smoke, drink alcohol, or use illegal drugs. Some items may interact with your medicine. What should I watch for while using this medicine? Visit your doctor or health care professional for regular checks on your progress. This medicine can cause swelling, tenderness, or bleeding of the gums. Be careful when brushing and flossing teeth. See your dentist regularly for routine dental care. You may get drowsy or dizzy. Do not drive, use machinery, or do anything that needs mental alertness until you know how this drug affects you. Do not stand or sit up quickly, especially if you are an older patient. This reduces the risk of dizzy or fainting spells. What side effects may I notice from receiving this medicine? Side effects that you should report to your doctor or health care professional as soon as possible: -allergic reactions like skin rash, itching or hives, swelling of the face, lips, or tongue -breast tissue changes or discharge -changes in vaginal bleeding during your period or between your periods -depression -muscle or bone pain -numbness or pain in the arm or  leg -pain in the chest, groin or leg -seizures or tremors -severe headache -stomach pain -sudden shortness of breath -unusually weak or tired -vision or speech problems -yellowing of skin or eyes Side effects that usually do not require medical attention (report to your doctor or health care professional if they continue or are bothersome): -acne -fluid retention and swelling -increased in appetite -mood changes, anxiety, depression, frustration, anger, or emotional outbursts -nausea, vomiting -sweating or hot flashes This list may not describe all possible side effects. Call your doctor for medical advice about side effects. You may report side effects to FDA at 1-800-FDA-1088. Where should I keep my medicine? Keep out of the reach of children. Store at room temperature between 15 and 30 degrees C (59 and 86 degrees F). Protect from light. Keep container tightly closed. Throw away any unused medicine after the expiration date. NOTE: This sheet is a summary. It may not cover all possible information. If you have questions about this medicine, talk to your doctor, pharmacist, or health care provider.  2015, [employer]. (2008-01-01 11:43:48) Estradiol tablets What is this medicine? ESTRADIOL (es tra DYE ole) is an estrogen. It is mostly used as hormone replacement in menopausal women. It helps to treat hot flashes and prevent osteoporosis. It is also used to treat women with low estrogen levels or those who have had their ovaries removed. This medicine may be used for other purposes; ask your health care provider or pharmacist if you have questions. COMMON BRAND NAME(S): Estrace, Femtrace, Gynodiol What should I tell my health care provider before I take this medicine? They need to know  if you have or ever had any of these conditions: -abnormal vaginal bleeding -blood vessel disease or blood clots -breast, cervical, endometrial, ovarian, liver, or uterine  cancer -dementia -diabetes -gallbladder disease -heart disease or recent heart attack -high blood pressure -high cholesterol -high level of calcium in the blood -hysterectomy -kidney disease -liver disease -migraine headaches -protein C deficiency -protein S deficiency -stroke -systemic lupus erythematosus (SLE) -tobacco smoker -an unusual or allergic reaction to estrogens, other hormones, medicines, foods, dyes, or preservatives -pregnant or trying to get pregnant -breast-feeding How should I use this medicine? Take this medicine by mouth. To reduce nausea, this medicine may be taken with food. Follow the directions on the prescription label. Take this medicine at the same time each day and in the order directed on the package. Do not take your medicine more often than directed. Contact your pediatrician regarding the use of this medicine in children. Special care may be needed. A patient package insert for the product will be given with each prescription and refill. Read this sheet carefully each time. The sheet may change frequently. Overdosage: If you think you have taken too much of this medicine contact a poison control center or emergency room at once. NOTE: This medicine is only for you. Do not share this medicine with others. What if I miss a dose? If you miss a dose, take it as soon as you can. If it is almost time for your next dose, take only that dose. Do not take double or extra doses. What may interact with this medicine? Do not take this medicine with any of the following medications: -aromatase inhibitors like aminoglutethimide, anastrozole, exemestane, letrozole, testolactone This medicine may also interact with the following medications: -carbamazepine -certain antibiotics used to treat infections -certain barbiturates or benzodiazepines used for inducing sleep or treating seizures -grapefruit juice -medicines for fungus infections like itraconazole and  ketoconazole -raloxifene or tamoxifen -rifabutin, rifampin, or rifapentine -ritonavir -St. John's Wort -warfarin This list may not describe all possible interactions. Give your health care provider a list of all the medicines, herbs, non-prescription drugs, or dietary supplements you use. Also tell them if you smoke, drink alcohol, or use illegal drugs. Some items may interact with your medicine. What should I watch for while using this medicine? Visit your doctor or health care professional for regular checks on your progress. You will need a regular breast and pelvic exam and Pap smear while on this medicine. You should also discuss the need for regular mammograms with your health care professional, and follow his or her guidelines for these tests. This medicine can make your body retain fluid, making your fingers, hands, or ankles swell. Your blood pressure can go up. Contact your doctor or health care professional if you feel you are retaining fluid. If you have any reason to think you are pregnant, stop taking this medicine right away and contact your doctor or health care professional. Smoking increases the risk of getting a blood clot or having a stroke while you are taking this medicine, especially if you are more than 49 years old. You are strongly advised not to smoke. If you wear contact lenses and notice visual changes, or if the lenses begin to feel uncomfortable, consult your eye doctor or health care professional. This medicine can increase the risk of developing a condition (endometrial hyperplasia) that may lead to cancer of the lining of the uterus. Taking progestins, another hormone drug, with this medicine lowers the risk of developing this condition.  Therefore, if your uterus has not been removed (by a hysterectomy), your doctor may prescribe a progestin for you to take together with your estrogen. You should know, however, that taking estrogens with progestins may have additional  health risks. You should discuss the use of estrogens and progestins with your health care professional to determine the benefits and risks for you. If you are going to have surgery, you may need to stop taking this medicine. Consult your health care professional for advice before you schedule the surgery. What side effects may I notice from receiving this medicine? Side effects that you should report to your doctor or health care professional as soon as possible: -allergic reactions like skin rash, itching or hives, swelling of the face, lips, or tongue -breast tissue changes or discharge -changes in vision -chest pain -confusion, trouble speaking or understanding -dark urine -general ill feeling or flu-like symptoms -light-colored stools -nausea, vomiting -pain, swelling, warmth in the leg -right upper belly pain -severe headaches -shortness of breath -sudden numbness or weakness of the face, arm or leg -trouble walking, dizziness, loss of balance or coordination -unusual vaginal bleeding -yellowing of the eyes or skin Side effects that usually do not require medical attention (report to your doctor or health care professional if they continue or are bothersome): -hair loss -increased hunger or thirst -increased urination -symptoms of vaginal infection like itching, irritation or unusual discharge -unusually weak or tired This list may not describe all possible side effects. Call your doctor for medical advice about side effects. You may report side effects to FDA at 1-800-FDA-1088. Where should I keep my medicine? Keep out of the reach of children. Store at room temperature between 20 and 25 degrees C (68 and 77 degrees F). Keep container tightly closed. Protect from light. Throw away any unused medicine after the expiration date. NOTE: This sheet is a summary. It may not cover all possible information. If you have questions about this medicine, talk to your doctor, pharmacist, or  health care provider.  2015, Elsevier/Gold Standard. (2010-04-19 09:19:24) Fluvastatin extended-release tablets What is this medicine? FLUVASTATIN (FLOO va sta tin) is known as an HMG-CoA reductase inhibitor or 'statin'. It lowers the level of cholesterol and triglycerides in the blood. This drug may also reduce the risk of heart attack, stroke, or other health problems in patients with risk factors for heart disease. Diet and lifestyle changes are often used with this drug. This medicine may be used for other purposes; ask your health care provider or pharmacist if you have questions. COMMON BRAND NAME(S): Lescol XL What should I tell my health care provider before I take this medicine? They need to know if you have any of these conditions: -frequently drink alcoholic beverages -kidney disease -liver disease -muscle aches or weakness -other medical condition -an unusual or allergic reaction to fluvastatin, other medicines, foods, dyes, or preservatives -pregnant or trying to get pregnant -breast-feeding How should I use this medicine? Take this medicine by mouth with a glass of water. Follow the directions on the prescription label. Do not chew or crush. You can take this medicine with or without food. If you are also taking cholestyramine or another bile acid resin, take this medicine at bedtime at least 2 hours after the bile acid resin. Take your doses at regular intervals. Do not take your medicine more often than directed. Talk to your pediatrician regarding the use of this medicine in children. Special care may be needed. Overdosage: If you think you have  taken too much of this medicine contact a poison control center or emergency room at once. NOTE: This medicine is only for you. Do not share this medicine with others. What if I miss a dose? If you miss a dose, take it as soon as you can if it is still the same day. Otherwise, wait until your next evening dose and get back on your  normal schedule. Do not take double or extra doses. What may interact with this medicine? Do not take this medicine with any of the following medications: -herbal medicines like red yeast rice This medicine may also interact with the following medications: -alcohol -colchicine -cyclosporine -digoxin -fluconazole -glyburide -medicines for stomach acid like cimetidine, omeprazole, ranitidine -niacin -other medicines for high cholesterol like clofibrate, fenofibrate, and gemfibrozil -phenytoin -rifampin -telithromycin -warfarin This list may not describe all possible interactions. Give your health care provider a list of all the medicines, herbs, non-prescription drugs, or dietary supplements you use. Also tell them if you smoke, drink alcohol, or use illegal drugs. Some items may interact with your medicine. What should I watch for while using this medicine? Visit your doctor or health care professional for regular check-ups. You may need regular tests to make sure your liver is working properly. Tell your doctor or health care professional right away if you get any unexplained muscle pain, tenderness, or weakness, especially if you also have a fever and tiredness. Your doctor or health care professional may tell you to stop taking this medicine if you develop muscle problems. If your muscle problems do not go away after stopping this medicine, contact your health care professional. This drug is only part of a total heart-health program. Your doctor or a dietician can suggest a low-cholesterol and low-fat diet to help. Avoid alcohol and smoking, and keep a proper exercise schedule. Do not use this drug if you are pregnant or breast-feeding. Serious side effects to an unborn child or to an infant are possible. Talk to your doctor or pharmacist for more information. If you are going to have surgery tell your health care professional that you are taking this drug. What side effects may I notice  from receiving this medicine? Side effects that you should report to your doctor or health care professional as soon as possible: -allergic reactions like skin rash, itching or hives, swelling of the face, lips, or tongue -dark urine -fever -joint pain -muscle cramps, pain -redness, blistering, peeling or loosening of the skin, including inside the mouth -trouble passing urine or change in the amount of urine -unusually weak or tired -yellowing of the eyes or skin Side effects that usually do not require medical attention (report to your doctor or health care professional if they continue or are bothersome): -constipation -heartburn -stomach gas, pain, upset This list may not describe all possible side effects. Call your doctor for medical advice about side effects. You may report side effects to FDA at 1-800-FDA-1088. Where should I keep my medicine? Keep out of the reach of children. Store at room temperature between 15 to 30 degrees C (59 to 86 degrees F). Protect from light. Keep container tightly closed. Throw away any unused medicine after the expiration date. NOTE: This sheet is a summary. It may not cover all possible information. If you have questions about this medicine, talk to your doctor, pharmacist, or health care provider.  2015, Elsevier/Gold Standard. (2010-12-05 10:30:02)

## 2014-06-02 NOTE — Progress Notes (Signed)
   Patient presented to the office today to discuss her blood work that was done at time of her annual exam on 05/19/2014. Patient been complaining of the following:  Hot flashes, irritability, mood swings, weight gain, decreased libido and insomnia  We reviewed the following blood tests and the following was noted:  Her BUN was repeated on April 27 due to the fact that on office visit on April 20 it was elevated at 24 by a follow-up and return back to normal at 22  Her lipid profile indicated that her total cholesterol was up to 124 last year was 217 and her LDL was elevated at 139 last year was 136.  Her [place] was found to be elevated at 75.6  The rest of her labs were normal.  We had a lengthy discussion of the women's health initiative study. We discussed the risk of estrogen and breast cancer or DVT or pulmonary embolism. We discussed different routes of administration of the medications. Patient had brought a list of her menstrual cycle and appears that she skipping every 2-3 months. With this clinical presentation as well as her elevated FSH at 75.6 currently indicates that this patient is perimenopausal/menopausal who is quality of life is affected.  We also discussed at length her hyperlipidemia despite her exercising and eating her LDL continues to be elevated as well as her total cholesterol. We discussed different statins. We are going to start her on Lescol XL 80 Mg twice a week and she'll return back to the office in 6 months for follow-up fasting lipid profile. The risks benefits and pros and con were discussed.   Assessment/plan: #1 menopause will be treated with Estrace 1 mg by mouth daily with the addition of Prometrium 200 mg daily the first 12 days of the month. #2 we discussed importance of calcium vitamin D and regular exercise for osteoporosis prevention #3 for hyperlipidemia she was prescribed Lescol 80 XL 80 mg twice a week and she is to return in 6 months for follow-up  fasting lipid profile. #4 literature information on the above was provided.  Greater than 50% of the time was spent in counseling and correlation of care with this patient.

## 2014-06-08 ENCOUNTER — Other Ambulatory Visit: Payer: Self-pay | Admitting: Gynecology

## 2014-06-08 NOTE — Telephone Encounter (Signed)
Called into pharmacy

## 2014-07-07 ENCOUNTER — Telehealth: Payer: Self-pay

## 2014-07-07 NOTE — Telephone Encounter (Signed)
Left message on patient's cell phone vm that Dr. Moshe Salisbury recommended office visit.

## 2014-07-07 NOTE — Telephone Encounter (Signed)
Was in a mos ago and you started her on HRT.  She said it has relieved her vasomotor symptoms and she is doing great with that. However, she is c/o "emotionally I am all over the board". "Snippy, up and down and lacking motivation".  She said she is usually an "even keel" type person and is having a hard time with this.  She asked if you could recommend anything else?  She said she is looking for something more short term. She even questioned if an anti-depressant might help?

## 2014-07-07 NOTE — Telephone Encounter (Signed)
Sounds to me like she needs an office visit

## 2014-07-26 ENCOUNTER — Other Ambulatory Visit: Payer: Self-pay

## 2014-07-28 ENCOUNTER — Ambulatory Visit: Payer: 59 | Admitting: Gynecology

## 2014-09-23 ENCOUNTER — Encounter: Payer: Self-pay | Admitting: Gynecology

## 2014-09-23 ENCOUNTER — Ambulatory Visit (INDEPENDENT_AMBULATORY_CARE_PROVIDER_SITE_OTHER): Payer: 59 | Admitting: Gynecology

## 2014-09-23 VITALS — BP 116/70

## 2014-09-23 DIAGNOSIS — R4586 Emotional lability: Secondary | ICD-10-CM

## 2014-09-23 DIAGNOSIS — R635 Abnormal weight gain: Secondary | ICD-10-CM | POA: Insufficient documentation

## 2014-09-23 DIAGNOSIS — F39 Unspecified mood [affective] disorder: Secondary | ICD-10-CM | POA: Diagnosis not present

## 2014-09-23 DIAGNOSIS — R232 Flushing: Secondary | ICD-10-CM | POA: Diagnosis not present

## 2014-09-23 NOTE — Patient Instructions (Signed)
Levonorgestrel intrauterine device (IUD) What is this medicine? LEVONORGESTREL IUD (LEE voe nor jes trel) is a contraceptive (birth control) device. The device is placed inside the uterus by a healthcare professional. It is used to prevent pregnancy and can also be used to treat heavy bleeding that occurs during your period. Depending on the device, it can be used for 3 to 5 years. This medicine may be used for other purposes; ask your health care provider or pharmacist if you have questions. COMMON BRAND NAME(S): LILETTA, Mirena, Skyla What should I tell my health care provider before I take this medicine? They need to know if you have any of these conditions: -abnormal Pap smear -cancer of the breast, uterus, or cervix -diabetes -endometritis -genital or pelvic infection now or in the past -have more than one sexual partner or your partner has more than one partner -heart disease -history of an ectopic or tubal pregnancy -immune system problems -IUD in place -liver disease or tumor -problems with blood clots or take blood-thinners -use intravenous drugs -uterus of unusual shape -vaginal bleeding that has not been explained -an unusual or allergic reaction to levonorgestrel, other hormones, silicone, or polyethylene, medicines, foods, dyes, or preservatives -pregnant or trying to get pregnant -breast-feeding How should I use this medicine? This device is placed inside the uterus by a health care professional. Talk to your pediatrician regarding the use of this medicine in children. Special care may be needed. Overdosage: If you think you have taken too much of this medicine contact a poison control center or emergency room at once. NOTE: This medicine is only for you. Do not share this medicine with others. What if I miss a dose? This does not apply. What may interact with this medicine? Do not take this medicine with any of the following  medications: -amprenavir -bosentan -fosamprenavir This medicine may also interact with the following medications: -aprepitant -barbiturate medicines for inducing sleep or treating seizures -bexarotene -griseofulvin -medicines to treat seizures like carbamazepine, ethotoin, felbamate, oxcarbazepine, phenytoin, topiramate -modafinil -pioglitazone -rifabutin -rifampin -rifapentine -some medicines to treat HIV infection like atazanavir, indinavir, lopinavir, nelfinavir, tipranavir, ritonavir -St. John's wort -warfarin This list may not describe all possible interactions. Give your health care provider a list of all the medicines, herbs, non-prescription drugs, or dietary supplements you use. Also tell them if you smoke, drink alcohol, or use illegal drugs. Some items may interact with your medicine. What should I watch for while using this medicine? Visit your doctor or health care professional for regular check ups. See your doctor if you or your partner has sexual contact with others, becomes HIV positive, or gets a sexual transmitted disease. This product does not protect you against HIV infection (AIDS) or other sexually transmitted diseases. You can check the placement of the IUD yourself by reaching up to the top of your vagina with clean fingers to feel the threads. Do not pull on the threads. It is a good habit to check placement after each menstrual period. Call your doctor right away if you feel more of the IUD than just the threads or if you cannot feel the threads at all. The IUD may come out by itself. You may become pregnant if the device comes out. If you notice that the IUD has come out use a backup birth control method like condoms and call your health care provider. Using tampons will not change the position of the IUD and are okay to use during your period. What side effects may   I notice from receiving this medicine? Side effects that you should report to your doctor or  health care professional as soon as possible: -allergic reactions like skin rash, itching or hives, swelling of the face, lips, or tongue -fever, flu-like symptoms -genital sores -high blood pressure -no menstrual period for 6 weeks during use -pain, swelling, warmth in the leg -pelvic pain or tenderness -severe or sudden headache -signs of pregnancy -stomach cramping -sudden shortness of breath -trouble with balance, talking, or walking -unusual vaginal bleeding, discharge -yellowing of the eyes or skin Side effects that usually do not require medical attention (report to your doctor or health care professional if they continue or are bothersome): -acne -breast pain -change in sex drive or performance -changes in weight -cramping, dizziness, or faintness while the device is being inserted -headache -irregular menstrual bleeding within first 3 to 6 months of use -nausea This list may not describe all possible side effects. Call your doctor for medical advice about side effects. You may report side effects to FDA at 1-800-FDA-1088. Where should I keep my medicine? This does not apply. NOTE: This sheet is a summary. It may not cover all possible information. If you have questions about this medicine, talk to your doctor, pharmacist, or health care provider.  2015, Elsevier/Gold Standard. (2011-02-15 13:54:04)  

## 2014-09-23 NOTE — Progress Notes (Signed)
   Patient is a 49 year old who was seen in the office in May of this year because of her complaints that she had of hot flashes, irritability, mood swings, weight gain and decreased libido and insomnia. Her [place] at that time was found to be elevated with a value of 75.6. At that visit we had had a lengthy discussion of the women's health initiative study. We discussed the risk of estrogen and breast cancer or DVT or pulmonary embolism. We discussed different routes of administration of the medications. Patient had brought a list of her menstrual cycle and appears that she skipping every 2-3 months. With this clinical presentation as well as her elevated FSH at 75.6 currently indicates that this patient is perimenopausal/menopausal who is quality of life is affected. She has denied any GU or GI complaints.  She was prescribed Estrace 1 mg daily with the addition of Prometrium 200 mg daily for first 12 days of the month. For the last 3 months she has had a light menstrual cycle during the time of the Prometrium. She stated that when she was much younger which she was on the oral contraception pill that she gaining weight until they switched her to a lower estrogen containing birth control pill. She states that she is exercising using healthier.  We discussed different alternative regimens to help with her symptoms as follows: #1 we'll decrease her Estrace from 1 mg to 0.5 mg daily #2 she will take the Prometrium for 12 days this month only and then return to the office for placement of the Mirena IUD for contraception and to protect the endometrium. #3 will monitor her symptoms over the course of the next 3 months after this regimen change. She may use periodically peppermint oral to apply behind-the-ear 1-2 dabs as needed. #4 if this regimen doesn't work we discussed a nonhormonal product called Relizen (consisting of natural food ingredients) whereby she would take 1 tablet twice a day. #5 with encouraged  once again the importance of regular exercise for at least 1 hour 3 times a week #6 we discussed healthy low-fat caloric diet.  Greater than 50% of the time was spent counseling patient coordinating care and providing literature information. Time of consultation 20 minutes.

## 2014-09-24 ENCOUNTER — Telehealth: Payer: Self-pay | Admitting: Gynecology

## 2014-09-24 NOTE — Telephone Encounter (Signed)
09/24/14-I LM VM cell number that her BC ins will cover the MIrena for contraception at 100%, no copay. She will call if she wants to proceed.wl

## 2014-10-07 ENCOUNTER — Encounter: Payer: Self-pay | Admitting: Gynecology

## 2014-10-07 ENCOUNTER — Ambulatory Visit (INDEPENDENT_AMBULATORY_CARE_PROVIDER_SITE_OTHER): Payer: 59 | Admitting: Gynecology

## 2014-10-07 VITALS — BP 118/76

## 2014-10-07 DIAGNOSIS — Z30430 Encounter for insertion of intrauterine contraceptive device: Secondary | ICD-10-CM | POA: Diagnosis not present

## 2014-10-07 DIAGNOSIS — Z23 Encounter for immunization: Secondary | ICD-10-CM | POA: Diagnosis not present

## 2014-10-07 NOTE — Progress Notes (Signed)
   Patient was last seen in the office on August 25. Her history is as follows: In May of this year patient had been complaining of hot flashes, irritability, mood swings and weight gain and decreased libido and insomnia.Her [place] at that time was found to be elevated with a value of 75.6. At that visit we had had a lengthy discussion of the women's health initiative study. We discussed the risk of estrogen and breast cancer or DVT or pulmonary embolism. We discussed different routes of administration of the medications. Patient had brought a list of her menstrual cycle and appears that she skipping every 2-3 months. With this clinical presentation as well as her elevated FSH at 75.6 currently indicates that this patient is perimenopausal/menopausal who is quality of life is affected. She has denied any GU or GI complaints.  She was prescribed Estrace 1 mg daily with the addition of Prometrium 200 mg daily for first 12 days of the month. For the last 3 months she has had a light menstrual cycle during the time of the Prometrium. She stated that when she was much younger which she was on the oral contraception pill that she gaining weight until they switched her to a lower estrogen containing birth control pill. She states that she is exercising using healthier.  She is currently on Estrace 0.5 mg which we reduced because of her concerns of weight gain. And she was on the Prometrium for 12 days of the month which we will discontinue since we are point to place a Mirena IUD today. The risk benefits and pros and cons were discussed.                                                                    IUD procedure note       Patient presented to the office today for placement of Mirena IUD. The patient had previously been provided with literature information on this method of contraception. The risks benefits and pros and cons were discussed and all her questions were answered. She is fully aware that this form of  contraception is 99% effective and is good for 5 years.  Pelvic exam: Bartholin urethra Skene glands: Within normal limits Vagina: No lesions or discharge Cervix: No lesions or discharge Uterus: Anteverted position Adnexa: No masses or tenderness Rectal exam: Not done  The cervix was cleansed with Betadine solution. A single-tooth tenaculum was placed on the anterior cervical lip. The uterus sounded to 7 centimeter. The IUD was shown to the patient and inserted in a sterile fashion. The IUD string was trimmed. The single-tooth tenaculum was removed. Patient was instructed to return back to the office in one month for follow up.   We will monitor symptoms over the course of the next 3 months. Patient received the flu vaccine today after she was counseled.

## 2014-10-07 NOTE — Patient Instructions (Signed)

## 2014-11-04 ENCOUNTER — Other Ambulatory Visit: Payer: Self-pay

## 2014-11-04 DIAGNOSIS — Z1231 Encounter for screening mammogram for malignant neoplasm of breast: Secondary | ICD-10-CM

## 2014-11-08 ENCOUNTER — Encounter: Payer: Self-pay | Admitting: Gynecology

## 2014-11-08 ENCOUNTER — Ambulatory Visit (INDEPENDENT_AMBULATORY_CARE_PROVIDER_SITE_OTHER): Payer: 59 | Admitting: Gynecology

## 2014-11-08 VITALS — BP 112/70

## 2014-11-08 DIAGNOSIS — Z30431 Encounter for routine checking of intrauterine contraceptive device: Secondary | ICD-10-CM | POA: Diagnosis not present

## 2014-11-08 NOTE — Progress Notes (Signed)
   Patient presented to the office today for her 1 month follow-up after having placed the Mirena IUD. Patient 49 years of age perimenopausal with [place] with a value of 75.6. Patient had been on hormone replacement therapy but with concerns of weight gain she was brought down from 1 mg to half a milligram daily and discontinue the Prometrium and have the Mirena IUD to protect her endometrium secondary to the local progesterone effect. Patient states that she feels much better now does not feel bloated is having no hot flashes and very minimal spotting after insertion of the IUD and currently not bleeding.  Exam: Bartholin urethra Skene was within normal limits Vagina: No lesions or discharge Cervix: No lesions or discharge IUD string not visualized Uterus: Anteverted normal size shape and consistency Adnexa: No palpable mass or tenderness Rectal exam not done  Assessment/plan: Perimenopausal patient doing well on half a milligram of Estrace daily with Mirena IUD placed one month ago. The IUD string was not visualized the patient will return back in one to 2 weeks to confirm the IUD is in the uterine cavity.

## 2014-12-01 ENCOUNTER — Ambulatory Visit (INDEPENDENT_AMBULATORY_CARE_PROVIDER_SITE_OTHER): Payer: 59

## 2014-12-01 ENCOUNTER — Other Ambulatory Visit: Payer: Self-pay | Admitting: Gynecology

## 2014-12-01 ENCOUNTER — Ambulatory Visit (INDEPENDENT_AMBULATORY_CARE_PROVIDER_SITE_OTHER): Payer: 59 | Admitting: Gynecology

## 2014-12-01 ENCOUNTER — Encounter: Payer: Self-pay | Admitting: Gynecology

## 2014-12-01 DIAGNOSIS — E785 Hyperlipidemia, unspecified: Secondary | ICD-10-CM | POA: Diagnosis not present

## 2014-12-01 DIAGNOSIS — Z975 Presence of (intrauterine) contraceptive device: Principal | ICD-10-CM

## 2014-12-01 DIAGNOSIS — N921 Excessive and frequent menstruation with irregular cycle: Secondary | ICD-10-CM | POA: Diagnosis not present

## 2014-12-01 DIAGNOSIS — Z30431 Encounter for routine checking of intrauterine contraceptive device: Secondary | ICD-10-CM

## 2014-12-01 NOTE — Progress Notes (Signed)
   Patient presented to the office to discuss her ultrasound due to the fact that she's had abnormal uterine bleeding since her IUD was placed back in September. Her history is as follows:  Patient 49 years of age perimenopausal with [place] with a value of 75.6. Patient had been on hormone replacement therapy but with concerns of weight gain she was brought down from 1 mg to half a milligram daily and discontinue the Prometrium and have the Mirena IUD to protect her endometrium secondary to the local progesterone effect. Patient states that she feels much better now does not feel bloated is having no hot flashes .  Ultrasound today: Uterus measured 9.2 x 5.3 x 4.4 cm endometrial stripe 6.3 mm. IUD was seen in the normal position. Right and left ovary were normal. And atrophic. No apparent adnexal masses. No fluid in the cul-de-sac.  Assessment/plan: Menopausal patient with elevated FSH of 75.6 and a Mirena IUD placed in September has had some irregular bleeding she is also on half a tablet of Estrace (0.5 mg daily) for vasomotor symptoms. I'm going to ask her for the next 30 days to increase the Estrace to 1 mg daily and we'll continue to monitor her symptoms if she continues bleeding she'll return back to the office. Patient also to return back to the office in a fasting state for a comprehensive metabolic panel as well as fasting lipid profile since she was started on Lescol 6 months ago for hyperlipidemia.

## 2014-12-07 ENCOUNTER — Other Ambulatory Visit: Payer: 59

## 2014-12-07 DIAGNOSIS — E785 Hyperlipidemia, unspecified: Secondary | ICD-10-CM

## 2014-12-07 LAB — COMPREHENSIVE METABOLIC PANEL
ALBUMIN: 4.2 g/dL (ref 3.6–5.1)
ALT: 12 U/L (ref 6–29)
AST: 13 U/L (ref 10–35)
Alkaline Phosphatase: 76 U/L (ref 33–115)
BILIRUBIN TOTAL: 0.3 mg/dL (ref 0.2–1.2)
BUN: 17 mg/dL (ref 7–25)
CALCIUM: 9 mg/dL (ref 8.6–10.2)
CO2: 25 mmol/L (ref 20–31)
Chloride: 104 mmol/L (ref 98–110)
Creat: 0.69 mg/dL (ref 0.50–1.10)
Glucose, Bld: 86 mg/dL (ref 65–99)
Potassium: 4.7 mmol/L (ref 3.5–5.3)
Sodium: 138 mmol/L (ref 135–146)
Total Protein: 7 g/dL (ref 6.1–8.1)

## 2014-12-07 LAB — LIPID PANEL
CHOLESTEROL: 184 mg/dL (ref 125–200)
HDL: 43 mg/dL — AB (ref >=46)
LDL Cholesterol: 116 mg/dL (ref <130)
TRIGLYCERIDES: 127 mg/dL (ref <150)
Total CHOL/HDL Ratio: 4.3 Ratio (ref <=5.0)
VLDL: 25 mg/dL (ref <30)

## 2014-12-10 ENCOUNTER — Ambulatory Visit
Admission: RE | Admit: 2014-12-10 | Discharge: 2014-12-10 | Disposition: A | Discharge status: Home / Self Care | Payer: 59 | Source: Ambulatory Visit

## 2014-12-10 DIAGNOSIS — Z1231 Encounter for screening mammogram for malignant neoplasm of breast: Secondary | ICD-10-CM

## 2014-12-14 ENCOUNTER — Other Ambulatory Visit: Payer: Self-pay | Admitting: Gynecology

## 2015-01-03 ENCOUNTER — Other Ambulatory Visit: Payer: Self-pay | Admitting: Gynecology

## 2015-01-03 NOTE — Telephone Encounter (Signed)
Called into pharmacy

## 2015-04-22 ENCOUNTER — Other Ambulatory Visit: Payer: Self-pay | Admitting: Gynecology

## 2015-04-22 NOTE — Telephone Encounter (Signed)
rx called in

## 2015-06-20 ENCOUNTER — Other Ambulatory Visit: Payer: Self-pay | Admitting: Gynecology

## 2015-06-20 NOTE — Telephone Encounter (Signed)
Per note on 12/01/14 "irregular bleeding she is also on half a tablet of Estrace (0.5 mg daily) for vasomotor symptoms. I'm going to ask her for the next 30 days to increase the Estrace to 1 mg daily and we'll continue to monitor her symptoms if she continues bleeding she'll return back to the office."  And the stain medication was refused because it was in 05/2014 when prescribed and pt should have came back for lab appointment.

## 2015-06-22 ENCOUNTER — Ambulatory Visit (INDEPENDENT_AMBULATORY_CARE_PROVIDER_SITE_OTHER): Payer: 59 | Admitting: Gynecology

## 2015-06-22 ENCOUNTER — Encounter: Payer: Self-pay | Admitting: Gynecology

## 2015-06-22 VITALS — BP 122/78 | Ht 65.5 in | Wt 154.6 lb

## 2015-06-22 DIAGNOSIS — Z79890 Hormone replacement therapy (postmenopausal): Secondary | ICD-10-CM

## 2015-06-22 DIAGNOSIS — E785 Hyperlipidemia, unspecified: Secondary | ICD-10-CM

## 2015-06-22 DIAGNOSIS — Z01419 Encounter for gynecological examination (general) (routine) without abnormal findings: Secondary | ICD-10-CM

## 2015-06-22 DIAGNOSIS — R21 Rash and other nonspecific skin eruption: Secondary | ICD-10-CM

## 2015-06-22 LAB — CBC WITH DIFFERENTIAL/PLATELET
BASOS ABS: 0 {cells}/uL (ref 0–200)
Basophils Relative: 0 %
EOS PCT: 1 %
Eosinophils Absolute: 87 cells/uL (ref 15–500)
HCT: 44 % (ref 35.0–45.0)
Hemoglobin: 14.3 g/dL (ref 11.7–15.5)
Lymphocytes Relative: 26 %
Lymphs Abs: 2262 cells/uL (ref 850–3900)
MCH: 29.7 pg (ref 27.0–33.0)
MCHC: 32.5 g/dL (ref 32.0–36.0)
MCV: 91.5 fL (ref 80.0–100.0)
MONOS PCT: 4 %
MPV: 9.6 fL (ref 7.5–12.5)
Monocytes Absolute: 348 cells/uL (ref 200–950)
NEUTROS ABS: 6003 {cells}/uL (ref 1500–7800)
Neutrophils Relative %: 69 %
PLATELETS: 309 10*3/uL (ref 140–400)
RBC: 4.81 MIL/uL (ref 3.80–5.10)
RDW: 13 % (ref 11.0–15.0)
WBC: 8.7 10*3/uL (ref 3.8–10.8)

## 2015-06-22 LAB — COMPREHENSIVE METABOLIC PANEL
ALT: 34 U/L — ABNORMAL HIGH (ref 6–29)
AST: 18 U/L (ref 10–35)
Albumin: 4.1 g/dL (ref 3.6–5.1)
Alkaline Phosphatase: 63 U/L (ref 33–130)
BUN: 22 mg/dL (ref 7–25)
CHLORIDE: 105 mmol/L (ref 98–110)
CO2: 23 mmol/L (ref 20–31)
CREATININE: 0.83 mg/dL (ref 0.50–1.05)
Calcium: 8.5 mg/dL — ABNORMAL LOW (ref 8.6–10.4)
Glucose, Bld: 84 mg/dL (ref 65–99)
Potassium: 4.5 mmol/L (ref 3.5–5.3)
SODIUM: 139 mmol/L (ref 135–146)
Total Bilirubin: 0.5 mg/dL (ref 0.2–1.2)
Total Protein: 6.8 g/dL (ref 6.1–8.1)

## 2015-06-22 LAB — LIPID PANEL
Cholesterol: 230 mg/dL — ABNORMAL HIGH (ref 125–200)
HDL: 64 mg/dL (ref >=46)
LDL CALC: 145 mg/dL — AB (ref <130)
Total CHOL/HDL Ratio: 3.6 Ratio (ref <=5.0)
Triglycerides: 105 mg/dL (ref <150)
VLDL: 21 mg/dL (ref <30)

## 2015-06-22 LAB — TSH: TSH: 1.01 m[IU]/L

## 2015-06-22 MED ORDER — ESTRADIOL 1 MG PO TABS: 1.0000 mg | ORAL_TABLET | Freq: Every day | ORAL | Status: DC

## 2015-06-22 MED ORDER — ALPRAZOLAM 0.25 MG PO TABS
0.2500 mg | ORAL_TABLET | Freq: Every evening | ORAL | Status: DC | PRN
Start: 2015-06-22 — End: 2015-09-17

## 2015-06-22 MED ORDER — CLOBETASOL PROPIONATE 0.05 % EX CREA: 1.0000 "application " | TOPICAL_CREAM | Freq: Two times a day (BID) | CUTANEOUS | Status: DC

## 2015-06-22 MED ORDER — FLUVASTATIN SODIUM ER 80 MG PO TB24: ORAL_TABLET | ORAL | Status: DC

## 2015-06-22 MED ORDER — HYDROXYZINE HCL 10 MG PO TABS: 10.0000 mg | ORAL_TABLET | Freq: Three times a day (TID) | ORAL | Status: DC | PRN

## 2015-06-22 NOTE — Patient Instructions (Addendum)
Bone Densitometry Bone densitometry is an imaging test that uses a special X-ray to measure the amount of calcium and other minerals in your bones (bone density). This test is also known as a bone mineral density test or dual-energy X-ray absorptiometry (DXA). The test can measure bone density at your hip and your spine. It is similar to having a regular X-ray. You may have this test to:  Diagnose a condition that causes weak or thin bones (osteoporosis).  Predict your risk of a broken bone (fracture).  Determine how well osteoporosis treatment is working. LET [facility] CARE PROVIDER KNOW ABOUT:  Any allergies you have.  All medicines you are taking, including vitamins, herbs, eye drops, creams, and over-the-counter medicines.  Previous problems you or members of your family have had with the use of anesthetics.  Any blood disorders you have.  Previous surgeries you have had.  Medical conditions you have.  Possibility of pregnancy.  Any other medical test you had within the previous 14 days that used contrast material. RISKS AND COMPLICATIONS Generally, this is a safe procedure. However, problems can occur and may include the following:  This test exposes you to a very small amount of radiation.  The risks of radiation exposure may be greater to unborn children. BEFORE THE PROCEDURE  Do not take any calcium supplements for 24 hours before having the test. You can otherwise eat and drink what you usually do.  Take off all metal jewelry, eyeglasses, dental appliances, and any other metal objects. PROCEDURE  You may lie on an exam table. There will be an X-ray generator below you and an imaging device above you.  Other devices, such as boxes or braces, may be used to position your body properly for the scan.  You will need to lie still while the machine slowly scans your body.  The images will show up on a computer monitor. AFTER THE PROCEDURE You may need more testing  at a later time.   This information is not intended to replace advice given to you by your health care provider. Make sure you discuss any questions you have with your health care provider.   Document Released: 02/07/2004 Document Revised: 02/05/2014 Document Reviewed: 06/25/2013 Elsevier Interactive Patient Education 2016 [place] Dermatitis Dermatitis is redness, soreness, and swelling (inflammation) of the skin. Contact dermatitis is a reaction to certain substances that touch the skin. There are two types of contact dermatitis:   Irritant contact dermatitis. This type is caused by something that irritates your skin, such as dry hands from washing them too much. This type does not require previous exposure to the substance for a reaction to occur. This type is more common.  Allergic contact dermatitis. This type is caused by a substance that you are allergic to, such as a nickel allergy or poison ivy. This type only occurs if you have been exposed to the substance (allergen) before. Upon a repeat exposure, your body reacts to the substance. This type is less common. CAUSES  Many different substances can cause contact dermatitis. Irritant contact dermatitis is most commonly caused by exposure to:   Makeup.   Soaps.   Detergents.   Bleaches.   Acids.   Metal salts, such as nickel.  Allergic contact dermatitis is most commonly caused by exposure to:   Poisonous plants.   Chemicals.   Jewelry.   Latex.   Medicines.   Preservatives in products, such as clothing.  RISK FACTORS This condition is more  likely to develop in:   People who have jobs that expose them to irritants or allergens.  People who have certain medical conditions, such as asthma or eczema.  SYMPTOMS  Symptoms of this condition may occur anywhere on your body where the irritant has touched you or is touched by you. Symptoms include:  Dryness or flaking.   Redness.    Cracks.   Itching.   Pain or a burning feeling.   Blisters.  Drainage of small amounts of blood or clear fluid from skin cracks. With allergic contact dermatitis, there may also be swelling in areas such as the eyelids, mouth, or genitals.  DIAGNOSIS  This condition is diagnosed with a medical history and physical exam. A patch skin test may be performed to help determine the cause. If the condition is related to your job, you may need to see an occupational medicine specialist. TREATMENT Treatment for this condition includes figuring out what caused the reaction and protecting your skin from further contact. Treatment may also include:   Steroid creams or ointments. Oral steroid medicines may be needed in more severe cases.  Antibiotics or antibacterial ointments, if a skin infection is present.  Antihistamine lotion or an antihistamine taken by mouth to ease itching.  A bandage (dressing). HOME CARE INSTRUCTIONS Skin Care  Moisturize your skin as needed.   Apply cool compresses to the affected areas.  Try taking a bath with:  Epsom salts. Follow the instructions on the packaging. You can get these at your local pharmacy or grocery store.  Baking soda. Pour a small amount into the bath as directed by your health care provider.  Colloidal oatmeal. Follow the instructions on the packaging. You can get this at your local pharmacy or grocery store.  Try applying baking soda paste to your skin. Stir water into baking soda until it reaches a paste-like consistency.  Do not scratch your skin.  Bathe less frequently, such as every other day.  Bathe in lukewarm water. Avoid using hot water. Medicines  Take or apply over-the-counter and prescription medicines only as told by your health care provider.   If you were prescribed an antibiotic medicine, take or apply your antibiotic as told by your health care provider. Do not stop using the antibiotic even if your  condition starts to improve. General Instructions  Keep all follow-up visits as told by your health care provider. This is important.  Avoid the substance that caused your reaction. If you do not know what caused it, keep a journal to try to track what caused it. Write down:  What you eat.  What cosmetic products you use.  What you drink.  What you wear in the affected area. This includes jewelry.  If you were given a dressing, take care of it as told by your health care provider. This includes when to change and remove it. SEEK MEDICAL CARE IF:   Your condition does not improve with treatment.  Your condition gets worse.  You have signs of infection such as swelling, tenderness, redness, soreness, or warmth in the affected area.  You have a fever.  You have new symptoms. SEEK IMMEDIATE MEDICAL CARE IF:   You have a severe headache, neck pain, or neck stiffness.  You vomit.  You feel very sleepy.  You notice red streaks coming from the affected area.  Your bone or joint underneath the affected area becomes painful after the skin has healed.  The affected area turns darker.  You have  difficulty breathing.   This information is not intended to replace advice given to you by your health care provider. Make sure you discuss any questions you have with your health care provider.   Document Released: 01/13/2000 Document Revised: 10/06/2014 Document Reviewed: 06/02/2014 Elsevier Interactive Patient Education 2016 [place]. Hydroxyzine capsules or tablets What is this medicine? HYDROXYZINE (hye Yarnell i zeen) is an antihistamine. This medicine is used to treat allergy symptoms. It is also used to treat anxiety and tension. This medicine can be used with other medicines to induce sleep before surgery. This medicine may be used for other purposes; ask your health care provider or pharmacist if you have questions. What should I tell my health care provider before I take  this medicine? They need to know if you have any of these conditions: -any chronic illness -difficulty passing urine -glaucoma -heart disease -kidney disease -liver disease -lung disease -an unusual or allergic reaction to hydroxyzine, cetirizine, other medicines, foods, dyes, or preservatives -pregnant or trying to get pregnant -breast-feeding How should I use this medicine? Take this medicine by mouth with a full glass of water. Follow the directions on the prescription label. You may take this medicine with food or on an empty stomach. Take your medicine at regular intervals. Do not take your medicine more often than directed. Talk to your pediatrician regarding the use of this medicine in children. Special care may be needed. While this drug may be prescribed for children as young as 24 years of age for selected conditions, precautions do apply. Patients over 65 years old may have a stronger reaction and need a smaller dose. Overdosage: If you think you have taken too much of this medicine contact a poison control center or emergency room at once. NOTE: This medicine is only for you. Do not share this medicine with others. What if I miss a dose? If you miss a dose, take it as soon as you can. If it is almost time for your next dose, take only that dose. Do not take double or extra doses. What may interact with this medicine? -alcohol -barbiturate medicines for sleep or seizures -medicines for colds, allergies -medicines for depression, anxiety, or emotional disturbances -medicines for pain -medicines for sleep -muscle relaxants This list may not describe all possible interactions. Give your health care provider a list of all the medicines, herbs, non-prescription drugs, or dietary supplements you use. Also tell them if you smoke, drink alcohol, or use illegal drugs. Some items may interact with your medicine. What should I watch for while using this medicine? Tell your doctor or  health care professional if your symptoms do not improve. You may get drowsy or dizzy. Do not drive, use machinery, or do anything that needs mental alertness until you know how this medicine affects you. Do not stand or sit up quickly, especially if you are an older patient. This reduces the risk of dizzy or fainting spells. Alcohol may interfere with the effect of this medicine. Avoid alcoholic drinks. Your mouth may get dry. Chewing sugarless gum or sucking hard candy, and drinking plenty of water may help. Contact your doctor if the problem does not go away or is severe. This medicine may cause dry eyes and blurred vision. If you wear contact lenses you may feel some discomfort. Lubricating drops may help. See your eye doctor if the problem does not go away or is severe. If you are receiving skin tests for allergies, tell your doctor you are using  this medicine. What side effects may I notice from receiving this medicine? Side effects that you should report to your doctor or health care professional as soon as possible: -fast or irregular heartbeat -difficulty passing urine -seizures -slurred speech or confusion -tremor Side effects that usually do not require medical attention (report to your doctor or health care professional if they continue or are bothersome): -constipation -drowsiness -fatigue -headache -stomach upset This list may not describe all possible side effects. Call your doctor for medical advice about side effects. You may report side effects to FDA at 1-800-FDA-1088. Where should I keep my medicine? Keep out of the reach of children. Store at room temperature between 15 and 30 degrees C (59 and 86 degrees F). Keep container tightly closed. Throw away any unused medicine after the expiration date. NOTE: This sheet is a summary. It may not cover all possible information. If you have questions about this medicine, talk to your doctor, pharmacist, or health care provider.     2016, Elsevier/Gold Standard. (2007-05-30 14:50:59) Clobetasol Propionate topical lotion What is this medicine? CLOBETASOL (kloe BAY ta sol) is a corticosteroid. It is used on the skin to treat itching, redness, and swelling caused by some skin conditions. This medicine may be used for other purposes; ask your health care provider or pharmacist if you have questions. What should I tell my health care provider before I take this medicine? They need to know if you have any of these conditions: -any type of active infection including measles, tuberculosis, herpes, or chickenpox -circulation problems or vascular disease -large areas of burned or damaged skin -rosacea -skin wasting or thinning -an unusual or allergic reaction to clobetasol, corticosteroids, other medicines, foods, dyes, or preservatives -pregnant or trying to get pregnant -breast-feeding How should I use this medicine? This medicine is for external use only. Do not take by mouth. Follow the directions on the prescription label. Wash your hands before and after use. Apply a thin film of medicine to the affected area. Do not cover with a bandage or dressing unless your doctor or health care professional tells you to. Do not use on healthy skin or over large areas of skin. Do not get this medicine in your eyes. If you do, rinse out with plenty of cool tap water. It is important not to use more medicine than prescribed. Do not use your medicine more often than directed. To do so may increase the chance of side effects. Talk to your pediatrician regarding the use of this medicine in children. Special care may be needed. Elderly patients are more likely to have damaged skin through aging, and this may increase side effects. This medicine should only be used for brief periods and infrequently in older patients. Overdosage: If you think you have taken too much of this medicine contact a poison control center or emergency room at once. NOTE:  This medicine is only for you. Do not share this medicine with others. What if I miss a dose? If you miss a dose, use it as soon as you can. If it is almost time for your next dose, use only that dose. Do not use double or extra doses without advice. What may interact with this medicine? Interactions are not expected. Do not use cosmetics or other skin care products on the treated area. This list may not describe all possible interactions. Give your health care provider a list of all the medicines, herbs, non-prescription drugs, or dietary supplements you use. Also tell them  if you smoke, drink alcohol, or use illegal drugs. Some items may interact with your medicine. What should I watch for while using this medicine? Tell your doctor or health care professional if your symptoms do not get better within 2 weeks, or if you develop skin irritation from the medicine. Tell your doctor or health care professional if you are exposed to anyone with measles or chickenpox, or if you develop sores or blisters that do not heal properly. What side effects may I notice from receiving this medicine? Side effects that you should report to your doctor or health care professional as soon as possible: -allergic reactions like skin rash, itching or hives, swelling of the face, lips, or tongue -changes in vision -lack of healing of the skin condition -painful, red, pus filled blisters on the skin or in hair follicles -thinning of the skin with easy bruising Side effects that usually do not require medical attention (report to your doctor or health care professional if they continue or are bothersome): -burning, irritation of the skin -redness or scaling of the skin This list may not describe all possible side effects. Call your doctor for medical advice about side effects. You may report side effects to FDA at 1-800-FDA-1088. Where should I keep my medicine? Keep out of the reach of children. Store at room  temperature between 20 and 25 degrees C (68 and 77 degrees F). Keep away from heat and direct light. Do not freeze. Throw away any unused medicine after the expiration date. NOTE: This sheet is a summary. It may not cover all possible information. If you have questions about this medicine, talk to your doctor, pharmacist, or health care provider.    2016, Elsevier/Gold Standard. (2007-04-30 17:21:53)

## 2015-06-22 NOTE — Progress Notes (Signed)
Amanda Zuniga 1965/07/03 PH:[id number]   History:    50 y.o.  for annual gyn exam who last year was started on hormone replacement therapy as a result of her vasomotor symptoms. An Seligman had confirmed that she was indeed in the menopausal range with a value of 75.6. She was started on Estrace 1 mg tablet which she takes half daily. Also we had placed the Mirena IUD last year since she was in the perimenopausal phase and had concerns about weight gain in the past. She has done well with no vasomotor symptoms she was in good spirits no weight gain. She is sleeping better now. Also she has been diagnosed with hyperlipidemia and had been started on fluvastatin  XL 80 mg which she takes twice a week and has tolerated well and on follow-up lipid profile liver function test were back to normal.  Patient has been complaining of a rash in both of her arms and legs and [facility] she had been using some topical over-the-counter steroid which she stopped a week ago and had been out in the beach in the sun and it made the areas worse. She had also been placed on an oral prednisone pack by another provider. Review of her record indicated in the past she has been followed by Dr. 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 In 2015 she had a colonoscopy as a result of hematochezia turned out to be internal hemorrhoids no polyps were identified. Patient with personal history of gestational diabetes with her first pregnancy and also strong family history of thyroid disease. In 2014 patient had an endometrial biopsy as a result of dysfunction uterine bleeding and pathology report was benign.  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 Followup Pap smears have been normal.  Patient is still not had a bone density study. She reports no irregular vaginal bleeding.  Past medical history,surgical  history, family history and social history were all reviewed and documented in the EPIC chart.  Gynecologic History Patient's last menstrual period was 11/13/2014. Contraception: IUD Last Pap: 2016. Results were: normal Last mammogram: 2016. Results were: normal  Obstetric History OB History  Gravida Para Term Preterm AB SAB TAB Ectopic Multiple Living  4 2 1 1 2 1  1  2     # Outcome Date GA Lbr Len/2nd Weight Sex Delivery Anes PTL Lv  4 Ectopic           3 SAB           2 Preterm     M CS-Unspec  Y Y  1 Term     M Vag-Spont  N Y       ROS: A ROS was performed and pertinent positives and negatives are included in the history.  GENERAL: No fevers or chills. HEENT: No change in vision, no earache, sore throat or sinus congestion. NECK: No pain or stiffness. CARDIOVASCULAR: No chest pain or pressure. No palpitations. PULMONARY: No shortness of breath, cough or wheeze. GASTROINTESTINAL: No abdominal pain, nausea, vomiting or diarrhea, melena or bright red blood per rectum. GENITOURINARY: No urinary frequency, urgency, hesitancy or dysuria. MUSCULOSKELETAL: No joint or muscle pain, no back pain, no recent trauma. DERMATOLOGIC: No rash, no itching, no lesions. ENDOCRINE: No polyuria, polydipsia, no heat or cold intolerance. No recent change in weight. HEMATOLOGICAL: No anemia or easy bruising or  bleeding. NEUROLOGIC: No headache, seizures, numbness, tingling or weakness. PSYCHIATRIC: No depression, no loss of interest in normal activity or change in sleep pattern.     Exam: chaperone present  BP 122/78 mmHg  Ht 5' 5.5" (1.664 m)  Wt 154 lb 9.6 oz (70.126 kg)  BMI 25.33 kg/m2  LMP 11/13/2014  Body mass index is 25.33 kg/(m^2).  General appearance : Well developed well nourished female. No acute distress HEENT: Eyes: no retinal hemorrhage or exudates,  Neck supple, trachea midline, no carotid bruits, no thyroidmegaly Lungs: Clear to auscultation, no rhonchi or wheezes, or rib  retractions  Heart: Regular rate and rhythm, no murmurs or gallops Breast:Examined in sitting and supine position were symmetrical in appearance, no palpable masses or tenderness,  no skin retraction, no nipple inversion, no nipple discharge, no skin discoloration, no axillary or supraclavicular lymphadenopathy Abdomen: no palpable masses or tenderness, no rebound or guarding Extremities: Both arms and legs with erythematous areas and plaques consistent with some form of contact dermatitis versus poison ivy.  Pelvic:  Bartholin, Urethra, Skene Glands: Within normal limits             Vagina: No gross lesions or discharge  Cervix: No gross lesions or discharge, IUD string seen  Uterus  anteverted, normal size, shape and consistency, non-tender and mobile  Adnexa  Without masses or tenderness  Anus and perineum  normal   Rectovaginal  normal sphincter tone without palpated masses or tenderness             Hemoccult cards provided     Assessment/Plan:  50 y.o. female for annual exam with hyperlipidemia and doing well on fluvastatin. We will check a fasting lipid profile along with the following screening blood work: Comprehensive metabolic panel, TSH, CBC, and urinalysis. We spent time evaluating her upper arms and lower extremities and going over her past history and further contact dermatitis that she has or poison ivy I'm going to start her on Vistaril 10 mg 1 tablet twice a day to 3 times a day for the next 7-10 days also on clobetasol 0.05% to apply twice a day for 7-10 days and once a week when necessary. I'm also with her her for her back to Dr. Martinique her dermatologist for a follow-up. We also discussed importance of calcium vitamin D and weightbearing exercises for osteoporosis prevention. Patient was provided with fecal Hemoccult cards to submit to the office for testing. Patient also schedule her bone density study. Patient to have her three-dimensional mammogram in November of this year.  Patient doing well on low-dose estrogen. Once again we discussed women's health initiative study the risks benefits and pros and cons of hormone replacement therapy.   Terrance Mass MD, 9:25 AM 06/22/2015

## 2015-06-23 LAB — URINALYSIS W MICROSCOPIC + REFLEX CULTURE
Bacteria, UA: NONE SEEN [HPF]
Bilirubin Urine: NEGATIVE
CASTS: NONE SEEN [LPF]
Crystals: NONE SEEN [HPF]
Glucose, UA: NEGATIVE
KETONES UR: NEGATIVE
Leukocytes, UA: NEGATIVE
NITRITE: NEGATIVE
PH: 5.5 (ref 5.0–8.0)
Protein, ur: NEGATIVE
RBC / HPF: NONE SEEN RBC/HPF (ref <=2)
SPECIFIC GRAVITY, URINE: 1.024 (ref 1.001–1.035)
WBC UA: NONE SEEN WBC/HPF (ref <=5)
Yeast: NONE SEEN [HPF]

## 2015-06-23 NOTE — Progress Notes (Signed)
Calcium 600 mg daily vitamin D 2000 units daily

## 2015-06-24 ENCOUNTER — Other Ambulatory Visit: Payer: Self-pay | Admitting: Gynecology

## 2015-06-24 ENCOUNTER — Telehealth: Payer: Self-pay

## 2015-06-24 DIAGNOSIS — R7989 Other specified abnormal findings of blood chemistry: Secondary | ICD-10-CM

## 2015-06-24 DIAGNOSIS — R945 Abnormal results of liver function studies: Principal | ICD-10-CM

## 2015-06-24 DIAGNOSIS — R3129 Other microscopic hematuria: Secondary | ICD-10-CM

## 2015-06-24 DIAGNOSIS — E7800 Pure hypercholesterolemia, unspecified: Secondary | ICD-10-CM

## 2015-06-24 NOTE — Telephone Encounter (Signed)
Tell her to increase it to 3 times a week. You can cancel the liver function test for next week we will recheck it in 6 months when we repeat her fasting lipid profile to see if her values improve. A slight elevation of the liver function tests like she has is not unusual being on cholesterol-lowering medication such as statin

## 2015-06-24 NOTE — Telephone Encounter (Signed)
-----   Message from Terrance Mass, MD sent at 06/23/2015  8:17 AM EDT ----- Please inform patient that she should be taking a calcium and vitamin D supplement daily since her calcium level was slightly low. Also I would like to repeat a urinalysis in one week since there was some blood in her urine. Also I would like to repeat a liver function tests since 1 enzymes was slightly elevated. Next week have her come in for a repeat urinalysis and an SGOT and SGPT. Also her cholesterol continues to be elevated with a total cholesterol at 230 normal less than 200 and her LDL cholesterol is elevated at 145 normal as less than 130. It was better 6 months ago. Please send her a cholesterol lowering diet handout and make sure that she adheres to 8 and exercises 3 times a week.

## 2015-06-24 NOTE — Telephone Encounter (Signed)
Patient was informed of results.  She wanted me to remind you that you have her on medication for cholesterol and she takes Fluvastatin XL twice weekly.  She wondered if you would want to increase it based on these results.

## 2015-06-24 NOTE — Telephone Encounter (Signed)
Left detailed message on her voice mail per [facility] access note. Orders placed.

## 2015-06-30 ENCOUNTER — Other Ambulatory Visit: Payer: Self-pay

## 2015-07-01 ENCOUNTER — Other Ambulatory Visit: Payer: 59

## 2015-07-01 DIAGNOSIS — R3129 Other microscopic hematuria: Secondary | ICD-10-CM

## 2015-07-01 LAB — URINALYSIS W MICROSCOPIC + REFLEX CULTURE
BACTERIA UA: NONE SEEN [HPF]
Bilirubin Urine: NEGATIVE
CASTS: NONE SEEN [LPF]
CRYSTALS: NONE SEEN [HPF]
GLUCOSE, UA: NEGATIVE
KETONES UR: NEGATIVE
Leukocytes, UA: NEGATIVE
Nitrite: NEGATIVE
Protein, ur: NEGATIVE
RBC / HPF: NONE SEEN RBC/HPF (ref <=2)
Specific Gravity, Urine: 1.019 (ref 1.001–1.035)
WBC, UA: NONE SEEN WBC/HPF (ref <=5)
YEAST: NONE SEEN [HPF]
pH: 5.5 (ref 5.0–8.0)

## 2015-07-04 ENCOUNTER — Telehealth: Payer: Self-pay | Admitting: *Deleted

## 2015-07-04 NOTE — Telephone Encounter (Signed)
-----   Message from Ramond Craver, [place] sent at 07/04/2015 12:30 PM EDT ----- Regarding: referral to urology Please inform patient that she continues to have blood in her urine but negative culture. Please make an appointment for her with [facility] for further evaluation.  Patient informed. She said she has family history of bladder cancer, GM and Aunt.  She has seen Dr. Jeffie Pollock in the past but it has been many years (greater than 5).  She would be happy to see him again if he is still there.

## 2015-07-04 NOTE — Telephone Encounter (Signed)
Referral notes faxed to [facility] they will fax me back with time and date to relay to the patient.

## 2015-07-13 NOTE — Telephone Encounter (Signed)
Pt informed

## 2015-07-13 NOTE — Telephone Encounter (Signed)
Appointment on 08/31/15 @ 9:00am with Dr.Wrenn, left message for pt to call.

## 2015-09-17 ENCOUNTER — Other Ambulatory Visit: Payer: Self-pay | Admitting: Gynecology

## 2015-09-19 NOTE — Telephone Encounter (Signed)
Rx called in 

## 2015-09-19 NOTE — Telephone Encounter (Signed)
Last filled in May with 3 refills

## 2015-09-26 ENCOUNTER — Telehealth: Payer: Self-pay | Admitting: *Deleted

## 2015-09-26 MED ORDER — FLUVASTATIN SODIUM ER 80 MG PO TB24: ORAL_TABLET | ORAL | 3 refills | Status: DC

## 2015-09-26 NOTE — Telephone Encounter (Signed)
Pt called stating she needs a new Rx for her fluvastatin XL was told on 06/24/15 to increased to three times weekly and recheck in 6 month which would be Nov. Rx was never sent for increase Rx will be sent today. Pt aware.

## 2015-11-17 ENCOUNTER — Other Ambulatory Visit: Payer: Self-pay | Admitting: Gynecology

## 2015-11-17 DIAGNOSIS — Z1231 Encounter for screening mammogram for malignant neoplasm of breast: Secondary | ICD-10-CM

## 2015-11-28 ENCOUNTER — Ambulatory Visit (INDEPENDENT_AMBULATORY_CARE_PROVIDER_SITE_OTHER): Payer: 59 | Admitting: Gynecology

## 2015-11-28 ENCOUNTER — Encounter: Payer: Self-pay | Admitting: Gynecology

## 2015-11-28 VITALS — BP 126/82

## 2015-11-28 DIAGNOSIS — R945 Abnormal results of liver function studies: Secondary | ICD-10-CM

## 2015-11-28 DIAGNOSIS — Z30432 Encounter for removal of intrauterine contraceptive device: Secondary | ICD-10-CM | POA: Diagnosis not present

## 2015-11-28 DIAGNOSIS — R7989 Other specified abnormal findings of blood chemistry: Secondary | ICD-10-CM | POA: Diagnosis not present

## 2015-11-28 DIAGNOSIS — Z79890 Hormone replacement therapy (postmenopausal): Secondary | ICD-10-CM

## 2015-11-28 DIAGNOSIS — G44001 Cluster headache syndrome, unspecified, intractable: Secondary | ICD-10-CM

## 2015-11-28 DIAGNOSIS — E7800 Pure hypercholesterolemia, unspecified: Secondary | ICD-10-CM

## 2015-11-28 DIAGNOSIS — R635 Abnormal weight gain: Secondary | ICD-10-CM

## 2015-11-28 LAB — LIPID PANEL
CHOL/HDL RATIO: 4 ratio (ref <=5.0)
Cholesterol: 190 mg/dL (ref 125–200)
HDL: 47 mg/dL (ref >=46)
LDL CALC: 102 mg/dL (ref <130)
Triglycerides: 204 mg/dL — ABNORMAL HIGH (ref <150)
VLDL: 41 mg/dL — ABNORMAL HIGH (ref <30)

## 2015-11-28 LAB — AST: AST: 16 U/L (ref 10–35)

## 2015-11-28 LAB — ALT: ALT: 21 U/L (ref 6–29)

## 2015-11-28 MED ORDER — ESTRADIOL 0.025 MG/24HR TD PTTW: 1.0000 | MEDICATED_PATCH | TRANSDERMAL | 12 refills | Status: DC

## 2015-11-28 MED ORDER — MEDROXYPROGESTERONE ACETATE 10 MG PO TABS: 10.0000 mg | ORAL_TABLET | Freq: Every day | ORAL | Status: DC

## 2015-11-28 NOTE — Progress Notes (Signed)
   Patient is a 50 year old that presented to the office today to discuss her hormone replacement therapy. She is concerned her weight gain also episodes of headaches. Review of her record indicated the following:  An [place] had confirmed that she was indeed in the menopausal range with a value of 75.6. She was started on Estrace 1 mg tablet which she takes half daily. Also we had placed the Mirena IUD last year since she was in the perimenopausal phase and had concerns about weight gain in the past. She has done well with no vasomotor symptoms and no bleeding reported. She also is here to check her liver function tests and fasting lipid profile since she is on statin for hyperlipidemia. She is currently on Estrace 1 mg daily and has a Mirena IUD.  We had a discussion about alternative forms of treatment perhaps switching her over to a transdermal low dose patch twice a week removing the Mirena IUD to minimize her weight gain and her headache and only having Provera 10 mg for 10 days of the month. She decided to proceed with this route. So she was taken to the exam room and after cleaning her cervix with Betadine solution and with the use of a [place] clamp the IUD was removed shown to the patient and discarded.  Assessment/plan: Menopausal patient with headaches and weight gain on oral estrogen and Mirena IUD. Mirena IUD was removed she will discontinue the Estrace and start mini Vivelle transdermal patch 0.025 twice a week. Risk benefits and pros and cons were discussed women's health initiative study was once again discussed as well as current recommendations and guidelines. Patient fully where potential risk of DVT, pulmonary embolism, and breast cancer at all been discussed as well.

## 2015-11-28 NOTE — Patient Instructions (Signed)
Medroxyprogesterone tablets  What is this medicine?  MEDROXYPROGESTERONE (me DROX ee proe JES te rone) is a hormone in a class called progestins. It is commonly used to prevent the uterine lining from overgrowth in women taking an estrogen after menopause. It is also used to treat irregular menstrual bleeding or a lack of menstrual bleeding in women.  This medicine may be used for other purposes; ask your health care provider or pharmacist if you have questions.  What should I tell my health care provider before I take this medicine?  They need to know if you have any of these conditions:  -blood vessel disease or a history of a blood clot in the lungs or legs  -breast, cervical or vaginal cancer  -heart disease  -kidney disease  -liver disease  -migraine  -recent miscarriage or abortion  -mental depression  -migraine  -seizures (convulsions)  -stroke  -vaginal bleeding that has not been evaluated  -an unusual or allergic reaction to medroxyprogesterone, other medicines, foods, dyes, or preservatives  -pregnant or trying to get pregnant  -breast-feeding  How should I use this medicine?  Take this medicine by mouth with a glass of water. Follow the directions on the prescription label. Take your doses at regular intervals. Do not take your medicine more often than directed.  Talk to your pediatrician regarding the use of this medicine in children. Special care may be needed. While this drug may be prescribed for children as young as 13 years for selected conditions, precautions do apply.  Overdosage: If you think you have taken too much of this medicine contact a poison control center or emergency room at once.  NOTE: This medicine is only for you. Do not share this medicine with others.  What if I miss a dose?  If you miss a dose, take it as soon as you can. If it is almost time for your next dose, take only that dose. Do not take double or extra doses.  What may interact with this medicine?  -barbiturate medicines  for inducing sleep or treating seizures (convulsions)  -bosentan  -carbamazepine  -phenytoin  -rifampin  -St. John's Wort  This list may not describe all possible interactions. Give your health care provider a list of all the medicines, herbs, non-prescription drugs, or dietary supplements you use. Also tell them if you smoke, drink alcohol, or use illegal drugs. Some items may interact with your medicine.  What should I watch for while using this medicine?  Visit your health care professional for regular checks on your progress. You will need a regular breast and pelvic exam.  If you have any reason to think you are pregnant, stop taking this medicine at once and contact your doctor or health care professional.  What side effects may I notice from receiving this medicine?  Side effects that you should report to your doctor or health care professional as soon as possible:  -breast tenderness or discharge  -changes in mood or emotions, such as depression  -changes in vision or speech  -pain in the abdomen, chest, groin, or leg  -severe headache  -skin rash, itching, or hives  -sudden shortness of breath  -unusually weak or tired  -yellowing of skin or eyes  Side effects that usually do not require medical attention (report to your doctor or health care professional if they continue or are bothersome):  -acne  -change in menstrual bleeding pattern or flow  -changes in sexual desire  -facial hair growth  -fluid   20 and 25 degrees C (68 and 77 degrees F). Throw away any unused medicine after the expiration date. NOTE: This sheet is a summary. It may not cover all possible information.  If you have questions about this medicine, talk to your doctor, pharmacist, or health care provider.    2016, Elsevier/Gold Standard. (2008-01-15 11:26:12) Estradiol skin patches What is this medicine? ESTRADIOL (es tra DYE ole) skin patches contain an estrogen. It is mostly used as hormone replacement in menopausal women. It helps to treat hot flashes and prevent osteoporosis. It is also used to treat women with low estrogen levels or those who have had their ovaries removed. This medicine may be used for other purposes; ask your health care provider or pharmacist if you have questions. What should I tell my health care provider before I take this medicine? They need to know if you have any of these conditions: -abnormal vaginal bleeding -blood vessel disease or blood clots -breast, cervical, endometrial, ovarian, liver, or uterine cancer -dementia -diabetes -gallbladder disease -heart disease or recent heart attack -high blood pressure -high cholesterol -high level of calcium in the blood -hysterectomy -kidney disease -liver disease -migraine headaches -protein C deficiency -protein S deficiency -stroke -systemic lupus erythematosus (SLE) -tobacco smoker -an unusual or allergic reaction to estrogens, other hormones, medicines, foods, dyes, or preservatives -pregnant or trying to get pregnant -breast-feeding How should I use this medicine? This medicine is for external use only. Follow the directions on the prescription label. Tear open the pouch, do not use scissors. Remove the stiff protective liner covering the adhesive. Try not to touch the adhesive. Apply the patch, sticky side to the skin, to an area that is clean, dry and hairless. Avoid injured, irritated, calloused, or scarred areas. Do not apply the skin patches to your breasts or around the waistline. Use a different site each time to prevent skin irritation. Do not cut or trim the patch. Do not stop using except on the  advice of your doctor or health care professional. Do not wear more than one patch at a time unless you are told to do so by your doctor or health care professional. Contact your pediatrician regarding the use of this medicine in children. Special care may be needed. A patient package insert for the product will be given with each prescription and refill. Read this sheet carefully each time. The sheet may change frequently. Overdosage: If you think you have taken too much of this medicine contact a poison control center or emergency room at once. NOTE: This medicine is only for you. Do not share this medicine with others. What if I miss a dose? If you miss a dose, apply it as soon as you can. If it is almost time for your next dose, apply only that dose. Do not apply double or extra doses. What may interact with this medicine? Do not take this medicine with any of the following medications: -aromatase inhibitors like aminoglutethimide, anastrozole, exemestane, letrozole, testolactone This medicine may also interact with the following medications: -carbamazepine -certain antibiotics used to treat infections -certain barbiturates used for inducing sleep or treating seizures -grapefruit juice -medicines for fungus infections like itraconazole and ketoconazole -raloxifene or tamoxifen -rifabutin, rifampin, or rifapentine -ritonavir -St. John's Wort This list may not describe all possible interactions. Give your health care provider a list of all the medicines, herbs, non-prescription drugs, or dietary supplements you use. Also tell them if you smoke, drink alcohol, or use illegal drugs. Some items may  interact with your medicine. What should I watch for while using this medicine? Visit your doctor or health care professional for regular checks on your progress. You will need a regular breast and pelvic exam and Pap smear while on this medicine. You should also discuss the need for regular  mammograms with your health care professional, and follow his or her guidelines for these tests. This medicine can make your body retain fluid, making your fingers, hands, or ankles swell. Your blood pressure can go up. Contact your doctor or health care professional if you feel you are retaining fluid. If you have any reason to think you are pregnant, stop taking this medicine right away and contact your doctor or health care professional. Smoking increases the risk of getting a blood clot or having a stroke while you are taking this medicine, especially if you are more than 50 years old. You are strongly advised not to smoke. If you wear contact lenses and notice visual changes, or if the lenses begin to feel uncomfortable, consult your eye doctor or health care professional. This medicine can increase the risk of developing a condition (endometrial hyperplasia) that may lead to cancer of the lining of the uterus. Taking progestins, another hormone drug, with this medicine lowers the risk of developing this condition. Therefore, if your uterus has not been removed (by a hysterectomy), your doctor may prescribe a progestin for you to take together with your estrogen. You should know, however, that taking estrogens with progestins may have additional health risks. You should discuss the use of estrogens and progestins with your health care professional to determine the benefits and risks for you. If you are going to have surgery or an MRI, you may need to stop taking this medicine. Consult your health care professional for advice before you schedule the surgery. You may bathe or participate in other activities while wearing your patch. If the patch pulls loose or falls off, you may reapply it if the patch is sticky enough to stay on the skin. You should reapply the patch in a different area. Use a fresh patch if it will no longer stick. What side effects may I notice from receiving this medicine? Side  effects that you should report to your doctor or health care professional as soon as possible: -allergic reactions like skin rash, itching or hives, swelling of the face, lips, or tongue -breast tissue changes or discharge -changes in vision -chest pain -confusion, trouble speaking or understanding -dark urine -general ill feeling or flu-like symptoms -light-colored stools -nausea, vomiting -pain, swelling, warmth in the leg -right upper belly pain -severe headaches -shortness of breath -sudden numbness or weakness of the face, arm or leg -trouble walking, dizziness, loss of balance or coordination -unusual vaginal bleeding -yellowing of the eyes or skin Side effects that usually do not require medical attention (report to your doctor or health care professional if they continue or are bothersome): -hair loss -increased hunger or thirst -increased urination -symptoms of vaginal infection like itching, irritation or unusual discharge -unusually weak or tired This list may not describe all possible side effects. Call your doctor for medical advice about side effects. You may report side effects to FDA at 1-800-FDA-1088. Where should I keep my medicine? Keep out of the reach of children. Store at room temperature below 30 degrees C (86 degrees F). Do not store any patches that have been removed from their protective pouch. Throw away any unused medicine after the expiration date. Dispose of  used patches properly. Since used patches may still contain active hormones, fold the patch in half so that it sticks to itself prior to disposal. NOTE: This sheet is a summary. It may not cover all possible information. If you have questions about this medicine, talk to your doctor, pharmacist, or health care provider.    2016, Elsevier/Gold Standard. (2010-04-19 09:19:41)

## 2015-12-04 ENCOUNTER — Other Ambulatory Visit: Payer: Self-pay | Admitting: Gynecology

## 2015-12-05 NOTE — Telephone Encounter (Signed)
Called into pharmacy

## 2015-12-12 ENCOUNTER — Ambulatory Visit
Admission: RE | Admit: 2015-12-12 | Discharge: 2015-12-12 | Disposition: A | Discharge status: Home / Self Care | Payer: 59 | Source: Ambulatory Visit | Attending: Gynecology | Admitting: Gynecology

## 2015-12-12 DIAGNOSIS — Z1231 Encounter for screening mammogram for malignant neoplasm of breast: Secondary | ICD-10-CM

## 2016-01-12 ENCOUNTER — Other Ambulatory Visit: Payer: Self-pay

## 2016-01-12 ENCOUNTER — Other Ambulatory Visit: Payer: Self-pay | Admitting: Gynecology

## 2016-01-12 MED ORDER — ESTRADIOL 0.025 MG/24HR TD PTTW: 1.0000 | MEDICATED_PATCH | TRANSDERMAL | 3 refills | Status: DC

## 2016-05-22 ENCOUNTER — Other Ambulatory Visit: Payer: Self-pay | Admitting: Gynecology

## 2016-06-11 ENCOUNTER — Other Ambulatory Visit: Payer: Self-pay | Admitting: Gynecology

## 2016-06-11 NOTE — Telephone Encounter (Signed)
Needs annual Gyn exam

## 2016-06-11 NOTE — Telephone Encounter (Signed)
Rx called in 

## 2016-06-11 NOTE — Telephone Encounter (Signed)
Last filled in 11/2014 with 5 refills.

## 2016-06-13 ENCOUNTER — Encounter: Payer: Self-pay | Admitting: Gynecology

## 2016-07-02 ENCOUNTER — Ambulatory Visit (INDEPENDENT_AMBULATORY_CARE_PROVIDER_SITE_OTHER): Payer: BLUE CROSS/BLUE SHIELD | Admitting: Gynecology

## 2016-07-02 ENCOUNTER — Encounter: Payer: Self-pay | Admitting: Gynecology

## 2016-07-02 VITALS — BP 102/76 | Ht 65.75 in | Wt 157.2 lb

## 2016-07-02 DIAGNOSIS — Z79890 Hormone replacement therapy (postmenopausal): Secondary | ICD-10-CM | POA: Diagnosis not present

## 2016-07-02 DIAGNOSIS — Z01419 Encounter for gynecological examination (general) (routine) without abnormal findings: Secondary | ICD-10-CM | POA: Diagnosis not present

## 2016-07-02 DIAGNOSIS — R609 Edema, unspecified: Secondary | ICD-10-CM | POA: Insufficient documentation

## 2016-07-02 DIAGNOSIS — Z8349 Family history of other endocrine, nutritional and metabolic diseases: Secondary | ICD-10-CM | POA: Diagnosis not present

## 2016-07-02 DIAGNOSIS — E782 Mixed hyperlipidemia: Secondary | ICD-10-CM | POA: Diagnosis not present

## 2016-07-02 DIAGNOSIS — Z780 Asymptomatic menopausal state: Secondary | ICD-10-CM

## 2016-07-02 LAB — CBC WITH DIFFERENTIAL/PLATELET
BASOS PCT: 0 %
Basophils Absolute: 0 cells/uL (ref 0–200)
EOS ABS: 86 {cells}/uL (ref 15–500)
Eosinophils Relative: 1 %
HEMATOCRIT: 41.5 % (ref 35.0–45.0)
HEMOGLOBIN: 13.7 g/dL (ref 11.7–15.5)
LYMPHS PCT: 23 %
Lymphs Abs: 1978 cells/uL (ref 850–3900)
MCH: 30 pg (ref 27.0–33.0)
MCHC: 33 g/dL (ref 32.0–36.0)
MCV: 91 fL (ref 80.0–100.0)
MPV: 9.7 fL (ref 7.5–12.5)
Monocytes Absolute: 602 cells/uL (ref 200–950)
Monocytes Relative: 7 %
Neutro Abs: 5934 cells/uL (ref 1500–7800)
Neutrophils Relative %: 69 %
Platelets: 344 10*3/uL (ref 140–400)
RBC: 4.56 MIL/uL (ref 3.80–5.10)
RDW: 12.9 % (ref 11.0–15.0)
WBC: 8.6 10*3/uL (ref 3.8–10.8)

## 2016-07-02 LAB — COMPREHENSIVE METABOLIC PANEL
ALK PHOS: 64 U/L (ref 33–130)
ALT: 15 U/L (ref 6–29)
AST: 16 U/L (ref 10–35)
Albumin: 4.3 g/dL (ref 3.6–5.1)
BUN: 17 mg/dL (ref 7–25)
CALCIUM: 9.3 mg/dL (ref 8.6–10.4)
CO2: 27 mmol/L (ref 20–31)
Chloride: 103 mmol/L (ref 98–110)
Creat: 0.84 mg/dL (ref 0.50–1.05)
Glucose, Bld: 83 mg/dL (ref 65–99)
POTASSIUM: 4.4 mmol/L (ref 3.5–5.3)
Sodium: 138 mmol/L (ref 135–146)
TOTAL PROTEIN: 7.2 g/dL (ref 6.1–8.1)
Total Bilirubin: 0.4 mg/dL (ref 0.2–1.2)

## 2016-07-02 LAB — LIPID PANEL
CHOL/HDL RATIO: 3.2 ratio (ref <5.0)
CHOLESTEROL: 195 mg/dL (ref <200)
HDL: 61 mg/dL (ref >50)
LDL Cholesterol: 118 mg/dL — ABNORMAL HIGH (ref <100)
Triglycerides: 79 mg/dL (ref <150)
VLDL: 16 mg/dL (ref <30)

## 2016-07-02 LAB — TSH: TSH: 1.35 m[IU]/L

## 2016-07-02 MED ORDER — ESTRADIOL 0.025 MG/24HR TD PTTW: 1.0000 | MEDICATED_PATCH | TRANSDERMAL | 3 refills | Status: DC

## 2016-07-02 MED ORDER — FLUVASTATIN SODIUM ER 80 MG PO TB24: ORAL_TABLET | ORAL | 11 refills | Status: DC

## 2016-07-02 MED ORDER — MEDROXYPROGESTERONE ACETATE 10 MG PO TABS: 10.0000 mg | ORAL_TABLET | Freq: Every day | ORAL | Status: DC

## 2016-07-02 MED ORDER — HYDROCHLOROTHIAZIDE 12.5 MG PO CAPS: 12.5000 mg | ORAL_CAPSULE | Freq: Every day | ORAL | 11 refills | Status: DC

## 2016-07-02 NOTE — Progress Notes (Signed)
Amanda Zuniga 1965-01-31 [id number]   History:    52 y.o.  for annual gyn exam who is maintained complaint is weight gain issue and fluid retention. Patient doing well as to her hormone replacement therapy that was prescribed as a result of elevated FSH and vasomotor symptoms. 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 Followup Pap smears have been normal. Patient had a colonoscopy at the age of 62 as a result of hematochezia with no abnormalities noted and she is on a 10 year recall. Patient has not had a bone density study as of yet. She is being followed twice a year by her dermatologist as a result of her basal cell carcinoma. Patient currently on Lescol as a result of her hyperlipidemia. She is fasting today for her blood work.  Past medical history,surgical history, family history and social history were all reviewed and documented in the EPIC chart.  Gynecologic History Patient's last menstrual period was 06/10/2016 (approximate). Contraception: post menopausal status Last Pap: 2016. Results were: normal Last mammogram: 2017. Results were: Normal but dense had three-dimensional mammogram  Obstetric History OB History  Gravida Para Term Preterm AB Living  4 2 1 1 2 2   SAB TAB Ectopic Multiple Live Births  1   1   2     # Outcome Date GA Lbr Len/2nd Weight Sex Delivery Anes PTL Lv  4 Ectopic           3 SAB           2 Preterm     M CS-Unspec  Y LIV  1 Term     M Vag-Spont  N LIV       ROS: A ROS was performed and pertinent positives and negatives are included in the history.  GENERAL: No fevers or chills. HEENT: No change in vision, no earache, sore throat or sinus congestion. NECK: No pain or stiffness. CARDIOVASCULAR: No chest pain or pressure. No palpitations. PULMONARY: No shortness of breath, cough or wheeze.  GASTROINTESTINAL: No abdominal pain, nausea, vomiting or diarrhea, melena or bright red blood per rectum. GENITOURINARY: No urinary frequency, urgency, hesitancy or dysuria. MUSCULOSKELETAL: No joint or muscle pain, no back pain, no recent trauma. DERMATOLOGIC: No rash, no itching, no lesions. ENDOCRINE: No polyuria, polydipsia, no heat or cold intolerance. No recent change in weight. HEMATOLOGICAL: No anemia or easy bruising or bleeding. NEUROLOGIC: No headache, seizures, numbness, tingling or weakness. PSYCHIATRIC: No depression, no loss of interest in normal activity or change in sleep pattern.     Exam: chaperone present  BP 102/76   Ht 5' 5.75" (1.67 m)   Wt 157 lb 3.2 oz (71.3 kg)   LMP 06/10/2016 (Approximate)   BMI 25.57 kg/m   Body mass index is 25.57 kg/m.  General appearance : Well developed well nourished female. No acute distress HEENT: Eyes: no retinal hemorrhage or exudates,  Neck supple, trachea midline, no carotid bruits, no thyroidmegaly Lungs: Clear to auscultation, no rhonchi or wheezes, or rib retractions  Heart: Regular rate and rhythm, no murmurs or gallops Breast:Examined in sitting and supine position were symmetrical in appearance, no palpable masses or tenderness,  no skin retraction, no nipple inversion, no nipple discharge, no skin discoloration, no axillary or supraclavicular lymphadenopathy Abdomen: no palpable masses or tenderness, no rebound  or guarding Extremities: no edema or skin discoloration or tenderness  Pelvic:  Bartholin, Urethra, Skene Glands: Within normal limits             Vagina: No gross lesions or discharge  Cervix: No gross lesions or discharge  Uterus  anteverted, normal size, shape and consistency, non-tender and mobile  Adnexa  Without masses or tenderness  Anus and perineum  normal   Rectovaginal  normal sphincter tone without palpated masses or tenderness             Hemoccult cards will be provided     Assessment/Plan:  51  y.o. female for annual exam with history of hyperlipidemia on Lescol 80 mg 3 times a week tolerating well. She is fasting today so the following fasting blood work will be ordered: Comprehensive metabolic panel, fasting lipid profile, as a result of her weight gain and family history of thyroid disease a TSH will be obtained today as well, as well as a CBC. Patient also to schedule her bone density study. We discussed importance of calcium vitamin D and weightbearing exercises for osteoporosis prevention. For her fluid retention she is going to be prescribed HCTZ 12.5 mg to take 3 times a week. She was encouraged to increase her fluid intake to minimize the risk of hypokalemia and to monitor her blood pressure. Fecal Hemoccult cards were provided for her to submit to the office for testing. Pap smear not done today.   Terrance Mass MD, 9:18 AM 07/02/2016

## 2016-07-30 ENCOUNTER — Other Ambulatory Visit: Payer: Self-pay | Admitting: Gynecology

## 2016-07-30 NOTE — Telephone Encounter (Signed)
Called into pharmacy

## 2016-09-10 ENCOUNTER — Telehealth: Payer: Self-pay | Admitting: *Deleted

## 2016-09-10 MED ORDER — ESTRADIOL 0.0375 MG/24HR TD PTTW: 1.0000 | MEDICATED_PATCH | TRANSDERMAL | 3 refills | Status: DC

## 2016-09-10 MED ORDER — MEDROXYPROGESTERONE ACETATE 10 MG PO TABS: ORAL_TABLET | ORAL | 3 refills | Status: DC

## 2016-09-10 NOTE — Telephone Encounter (Signed)
Left a detailed message Rx sent, with new Rx for provera.

## 2016-09-10 NOTE — Telephone Encounter (Signed)
Yes, can increase Vivelle dot ot 0.0375.  But recommend increasing the Provera 10 mg PO to day 1-12 of the month as well.

## 2016-09-10 NOTE — Telephone Encounter (Signed)
Pt will continue her care with you since Dr.Fernandez retirement, takes vivelle dot 0.025 mg twice weekly and provera 10mg  day 1-10 monthly. Pt said for the last couple of weeks night sweats have returned asked if patch should be increased? Please advise

## 2016-10-26 ENCOUNTER — Telehealth: Payer: Self-pay | Admitting: *Deleted

## 2016-10-26 NOTE — Telephone Encounter (Signed)
Pharmacy needed new DEA number for provider due to [place] retirement. Pt will be seeing Dr. Dellis Filbert. I informed pharmacist of Dr.Lavoie name and dea, refill on Xanax provided.

## 2016-11-22 ENCOUNTER — Other Ambulatory Visit: Payer: Self-pay | Admitting: Obstetrics & Gynecology

## 2016-11-22 DIAGNOSIS — Z1231 Encounter for screening mammogram for malignant neoplasm of breast: Secondary | ICD-10-CM

## 2016-12-17 ENCOUNTER — Ambulatory Visit
Admission: RE | Admit: 2016-12-17 | Discharge: 2016-12-17 | Disposition: A | Discharge status: Home / Self Care | Payer: 59 | Source: Ambulatory Visit | Attending: Obstetrics & Gynecology | Admitting: Obstetrics & Gynecology

## 2016-12-17 DIAGNOSIS — Z1231 Encounter for screening mammogram for malignant neoplasm of breast: Secondary | ICD-10-CM

## 2017-01-25 ENCOUNTER — Other Ambulatory Visit: Payer: Self-pay | Admitting: Obstetrics & Gynecology

## 2017-01-25 NOTE — Telephone Encounter (Signed)
Last filled in July with 5 refills,

## 2017-01-28 NOTE — Telephone Encounter (Signed)
Rx called in 

## 2017-03-12 ENCOUNTER — Other Ambulatory Visit: Payer: Self-pay | Admitting: Obstetrics & Gynecology

## 2017-03-13 NOTE — Telephone Encounter (Signed)
Former patient of Dr. Moshe Salisbury.  She has been using Xanax for quite awhile. Dr. Moshe Salisbury wrote in his 04/21/12 office note "She does take Xanax on a when necessary basis for anxiety some time." He last prescribed #30 w 5 refills on 07/30/2016.  She said she plans to see you for her annual due in June.

## 2017-03-13 NOTE — Telephone Encounter (Signed)
Called into pharmacy

## 2017-05-08 ENCOUNTER — Other Ambulatory Visit: Payer: Self-pay | Admitting: Obstetrics & Gynecology

## 2017-05-08 NOTE — Telephone Encounter (Signed)
Called into pharmacy

## 2017-05-08 NOTE — Telephone Encounter (Signed)
Former patent of Dr. Durenda Guthrie getting Xanax Rx since 2012.  His 04/21/12 office note states "She does take Xanax on a when necessary basis for anxiety,"   She is current with [facility] and will be due in June.

## 2017-05-28 ENCOUNTER — Other Ambulatory Visit: Payer: Self-pay | Admitting: *Deleted

## 2017-05-28 MED ORDER — FLUVASTATIN SODIUM ER 80 MG PO TB24: ORAL_TABLET | ORAL | 0 refills | Status: DC

## 2017-05-28 NOTE — Telephone Encounter (Signed)
Patient has annual scheduled with you in July, formed Dr.Fernandez patient requesting refill on fluvastatin ER 80 mg tablet 3 times daily.  Please advise

## 2017-05-28 NOTE — Telephone Encounter (Signed)
Recommend that patient's Statin medication be prescribed by a Family MD.  Can refill until Annual/Gyn visit, but in meantime please see/find a Fam MD..Marland Kitchen

## 2017-05-29 NOTE — Telephone Encounter (Signed)
Pt informed,

## 2017-06-10 ENCOUNTER — Other Ambulatory Visit: Payer: Self-pay

## 2017-06-10 ENCOUNTER — Ambulatory Visit (INDEPENDENT_AMBULATORY_CARE_PROVIDER_SITE_OTHER): Payer: BLUE CROSS/BLUE SHIELD | Admitting: Family Medicine

## 2017-06-10 ENCOUNTER — Encounter: Payer: Self-pay | Admitting: Family Medicine

## 2017-06-10 VITALS — BP 116/74 | HR 80 | Temp 98.4°F | Resp 15 | Ht 65.0 in | Wt 160.3 lb

## 2017-06-10 DIAGNOSIS — J302 Other seasonal allergic rhinitis: Secondary | ICD-10-CM | POA: Diagnosis not present

## 2017-06-10 DIAGNOSIS — E782 Mixed hyperlipidemia: Secondary | ICD-10-CM | POA: Diagnosis not present

## 2017-06-10 DIAGNOSIS — Z131 Encounter for screening for diabetes mellitus: Secondary | ICD-10-CM

## 2017-06-10 DIAGNOSIS — Z0000 Encounter for general adult medical examination without abnormal findings: Secondary | ICD-10-CM | POA: Diagnosis not present

## 2017-06-10 NOTE — Progress Notes (Signed)
Patient presents to clinic today to establish care.  SUBJECTIVE: PMH: Pt is a 52 yo female with pmh sig for HLD, and seasonal allergies.  Pt has not had a primary provider in 10 yrs.  Pt's OB/Gyn provider Dr. Toney Rakes recently retired.  Pt states she established care with a new OB provider who suggested she find a pcp to manage her cholesterol.   HLD: -last lipid panel 1 yr ago -taking fluvastatin XL 80 mg 3 times per week -Patient is exercising 3-4 times per week, but does admit to eating "good things"  Seasonal allergies: -Patient using Zyrtec and a nasal spray as needed  Postmenopausal: -Patient endorses HRT.  Allergies: NKDA  Past surgical history: C-section Laparoscopy for ovarian cyst and endometriosis  Social history: Patient is married.  She currently works at [employer].  Patient has 2 boys ages 68 and 1.  Pt endorses social alcohol use.  Patient denies tobacco and drug use.  Colonoscopy up-to-date.  Done at age 19.  Repeat in 10 years.  Past Medical History:  Diagnosis Date  . Anxiety   . BCC (basal cell carcinoma of skin)    chest and back and right leg  . CIN I (cervical intraepithelial neoplasia I)   . Ectopic pregnancy    HISTORY OF ECTOPIC/METHOTREXATE  . NSVD (normal spontaneous vaginal delivery)    macrosomia    Past Surgical History:  Procedure Laterality Date  . ABLATION ON ENDOMETRIOSIS N/A 07/01/2013   Procedure: Fulguration of ENDOMETRIOSIS;  Surgeon: Terrance Mass, MD;  Location: [facility];  Service: Gynecology;  Laterality: N/A;  . CERVICAL BIOPSY  W/ LOOP ELECTRODE EXCISION    . CESAREAN SECTION  2004   33 weeks-hyprops, fetal tachycardia  . COLONOSCOPY     2016  . DILATION AND CURETTAGE OF UTERUS  1997   miscarriage  . LAPAROSCOPY Bilateral 07/01/2013   Procedure: LAPAROSCOPY OPERATIVE/BILATERAL OVARIAN CYSTECTOMY;  Surgeon: Terrance Mass, MD;  Location: [facility];  Service: Gynecology;  Laterality: Bilateral;  bilateral ovarian cystectomy     Current Outpatient Medications on File Prior to Visit  Medication Sig Dispense Refill  . ALPRAZolam (XANAX) 0.25 MG tablet TAKE 1 TABLET AT BEDTIME AS NEEDED FOR SLEEP 30 tablet 2  . estradiol (VIVELLE-DOT) 0.0375 MG/24HR Place 1 patch onto the skin 2 (two) times a week. 24 patch 3  . famotidine (PEPCID) 10 MG tablet Take 10 mg by mouth daily as needed for heartburn.    . fluvastatin XL (LESCOL XL) 80 MG 24 hr tablet One tablet three times a week 12 tablet 0  . ibuprofen (ADVIL,MOTRIN) 200 MG tablet Take 600-800 mg by mouth 2 (two) times daily as needed for headache. Reported on 06/22/2015    . medroxyPROGESTERone (PROVERA) 10 MG tablet Take one tablet by mouth day 1-12 each month 36 tablet 3  . Multiple Vitamin (MULTIVITAMIN) tablet Take 1 tablet by mouth daily.    . hydrochlorothiazide (MICROZIDE) 12.5 MG capsule Take 1 capsule (12.5 mg total) by mouth daily. Take one every morning (Patient not taking: Reported on 06/10/2017) 30 capsule 11  . nitrofurantoin, macrocrystal-monohydrate, (MACROBID) 100 MG capsule TAKE 1 CAPSULE BY MOUTH AS DIRECTED WHEN NECESSARY (Patient not taking: Reported on 07/02/2016) 30 capsule 5   No current facility-administered medications on file prior to visit.     No Known Allergies  Family History  Problem Relation Age of Onset  . Bladder Cancer Mother   . Heart disease Mother  MI  . Bladder Cancer Maternal Grandmother   . Colon cancer Neg Hx   . Esophageal cancer Neg Hx   . Rectal cancer Neg Hx   . Stomach cancer Neg Hx     Social History   Socioeconomic History  . Marital status: Married    Spouse name: Not on file  . Number of children: 2  . Years of education: Not on file  . Highest education level: Not on file  Occupational History  . Occupation: Best boy: AT [employer]  . Financial resource strain: Not on file  . Food insecurity:    Worry: Not on file    Inability: Not on file  . Transportation needs:     Medical: Not on file    Non-medical: Not on file  Tobacco Use  . Smoking status: Never Smoker  . Smokeless tobacco: Never Used  Substance and Sexual Activity  . Alcohol use: Yes    Alcohol/week: 0.0 oz    Comment: socially  . Drug use: No  . Sexual activity: Yes    Comment: PARTNER WITH VASECTOMY  Lifestyle  . Physical activity:    Days per week: Not on file    Minutes per session: Not on file  . Stress: Not on file  Relationships  . Social connections:    Talks on phone: Not on file    Gets together: Not on file    Attends religious service: Not on file    Active member of club or organization: Not on file    Attends meetings of clubs or organizations: Not on file    Relationship status: Not on file  . Intimate partner violence:    Fear of current or ex partner: Not on file    Emotionally abused: Not on file    Physically abused: Not on file    Forced sexual activity: Not on file  Other Topics Concern  . Not on file  Social History Narrative  . Not on file    ROS General: Denies fever, chills, night sweats, changes in weight, changes in appetite HEENT: Denies headaches, ear pain, changes in vision, rhinorrhea, sore throat CV: Denies CP, palpitations, SOB, orthopnea Pulm: Denies SOB, cough, wheezing GI: Denies abdominal pain, nausea, vomiting, diarrhea, constipation GU: Denies dysuria, hematuria, frequency, vaginal discharge Msk: Denies muscle cramps, joint pains Neuro: Denies weakness, numbness, tingling Skin: Denies rashes, bruising Psych: Denies depression, anxiety, hallucinations  BP 116/74   Pulse 80   Temp 98.4 F (36.9 C) (Oral)   Resp 15   Ht 5\' 5"  (1.651 m)   Wt 160 lb 4.8 oz (72.7 kg)   LMP 06/10/2016 (Approximate)   SpO2 98%   BMI 26.68 kg/m   Physical Exam Gen. Pleasant, well developed, well-nourished, in NAD HEENT - [place]/AT, PERRL, no scleral icterus, no nasal drainage, pharynx without erythema or exudate.  TMs normal bilaterally.  No cervical  lymphadenopathy. Neck: No JVD, no thyromegaly, no carotid bruits Lungs: no use of accessory muscles, CTAB, no wheezes, rales or rhonchi Cardiovascular: RRR, No r/g/m, no peripheral edema Abdomen: BS present, soft, nontender, nondistended, no hepatosplenomegaly Musculoskeletal: No deformities, moves all four extremities, no cyanosis or clubbing, normal tone Neuro:  A&Ox3, CN II-XII intact, normal gait Skin:  Warm, dry, intact, no lesions Psych: normal affect, mood appropriate  No results found for this or any previous visit (from the past 2160 hour(s)).  Assessment/Plan: Well adult exam  -Anticipatory guidance given including wearing  seatbelts, smoke detectors in the home, increasing physical activity, increasing p.o. intake of water and vegetables. -We will obtain labs.  Patient wishes to return when fasting. -Pap up-to-date -Next CPE in 1 year - Plan: CBC with Differential/Platelet, Basic metabolic panel  Seasonal allergies -Continue Zyrtec and OTC nasal spray as needed  Mixed hyperlipidemia  -Continue fluvastatin XL 80 mg 3 times per week - Plan: Lipid panel  Screening for diabetes mellitus  - Plan: Hemoglobin A1c  Follow-up PRN  Grier Mitts, MD

## 2017-06-10 NOTE — Patient Instructions (Addendum)
Lab is open each week day from 7:30 AM to 5:30 PM.  It is located here in the clinic.  Just stop by the front desk and tell them you are here for labs. Preventive Care 40-64 Years, Female Preventive care refers to lifestyle choices and visits with your health care provider that can promote health and wellness. What does preventive care include?  A yearly physical exam. This is also called an annual well check.  Dental exams once or twice a year.  Routine eye exams. Ask your health care provider how often you should have your eyes checked.  Personal lifestyle choices, including: ? Daily care of your teeth and gums. ? Regular physical activity. ? Eating a healthy diet. ? Avoiding tobacco and drug use. ? Limiting alcohol use. ? Practicing safe sex. ? Taking low-dose aspirin daily starting at age 68. ? Taking vitamin and mineral supplements as recommended by your health care provider. What happens during an annual well check? The services and screenings done by your health care provider during your annual well check will depend on your age, overall health, lifestyle risk factors, and family history of disease. Counseling Your health care provider may ask you questions about your:  Alcohol use.  Tobacco use.  Drug use.  Emotional well-being.  Home and relationship well-being.  Sexual activity.  Eating habits.  Work and work Statistician.  Method of birth control.  Menstrual cycle.  Pregnancy history.  Screening You may have the following tests or measurements:  Height, weight, and BMI.  Blood pressure.  Lipid and cholesterol levels. These may be checked every 5 years, or more frequently if you are over 71 years old.  Skin check.  Lung cancer screening. You may have this screening every year starting at age 32 if you have a 30-pack-year history of smoking and currently smoke or have quit within the past 15 years.  Fecal occult blood test (FOBT) of the stool. You  may have this test every year starting at age 8.  Flexible sigmoidoscopy or colonoscopy. You may have a sigmoidoscopy every 5 years or a colonoscopy every 10 years starting at age 58.  Hepatitis C blood test.  Hepatitis B blood test.  Sexually transmitted disease (STD) testing.  Diabetes screening. This is done by checking your blood sugar (glucose) after you have not eaten for a while (fasting). You may have this done every 1-3 years.  Mammogram. This may be done every 1-2 years. Talk to your health care provider about when you should start having regular mammograms. This may depend on whether you have a family history of breast cancer.  BRCA-related cancer screening. This may be done if you have a family history of breast, ovarian, tubal, or peritoneal cancers.  Pelvic exam and Pap test. This may be done every 3 years starting at age 8. Starting at age 64, this may be done every 5 years if you have a Pap test in combination with an HPV test.  Bone density scan. This is done to screen for osteoporosis. You may have this scan if you are at high risk for osteoporosis.  Discuss your test results, treatment options, and if necessary, the need for more tests with your health care provider. Vaccines Your health care provider may recommend certain vaccines, such as:  Influenza vaccine. This is recommended every year.  Tetanus, diphtheria, and acellular pertussis (Tdap, Td) vaccine. You may need a Td booster every 10 years.  Varicella vaccine. You may need this if you  have not been vaccinated.  Zoster vaccine. You may need this after age 69.  Measles, mumps, and rubella (MMR) vaccine. You may need at least one dose of MMR if you were born in 1957 or later. You may also need a second dose.  Pneumococcal 13-valent conjugate (PCV13) vaccine. You may need this if you have certain conditions and were not previously vaccinated.  Pneumococcal polysaccharide (PPSV23) vaccine. You may need one  or two doses if you smoke cigarettes or if you have certain conditions.  Meningococcal vaccine. You may need this if you have certain conditions.  Hepatitis A vaccine. You may need this if you have certain conditions or if you travel or work in places where you may be exposed to hepatitis A.  Hepatitis B vaccine. You may need this if you have certain conditions or if you travel or work in places where you may be exposed to hepatitis B.  Haemophilus influenzae type b (Hib) vaccine. You may need this if you have certain conditions.  Talk to your health care provider about which screenings and vaccines you need and how often you need them. This information is not intended to replace advice given to you by your health care provider. Make sure you discuss any questions you have with your health care provider. Document Released: 02/11/2015 Document Revised: 10/05/2015 Document Reviewed: 11/16/2014 Elsevier Interactive Patient Education  2018 [place].  Preventing High Cholesterol Cholesterol is a waxy, fat-like substance that your body needs in small amounts. Your liver makes all the cholesterol that your body needs. Having high cholesterol (hypercholesterolemia) increases your risk for heart disease and stroke. Extra (excess) cholesterol comes from the food you eat, such as animal-based fat (saturated fat) from meat and some dairy products. High cholesterol can often be prevented with diet and lifestyle changes. If you already have high cholesterol, you can control it with diet and lifestyle changes, as well as medicine. What nutrition changes can be made?  Eat less saturated fat. Foods that contain saturated fat include red meat and some dairy products.  Avoid processed meats, like bacon and lunch meats.  Avoid trans fats, which are found in margarine and some baked goods.  Avoid foods and beverages that have added sugars.  Eat more fruits, vegetables, and whole grains.  Choose healthy  sources of protein, such as fish, poultry, and nuts.  Choose healthy sources of fat, such as: ? Nuts. ? Vegetable oils, especially olive oil. ? Fish that have healthy fats (omega-3 fatty acids), such as mackerel or salmon. What lifestyle changes can be made?  Lose weight if you are overweight. Losing 5-10 lb (2.3-4.5 kg) can help prevent or control high cholesterol and reduce your risk for diabetes and high blood pressure. Ask your health care provider to help you with a diet and exercise plan to safely lose weight.  Get enough exercise. Do at least 150 minutes of moderate-intensity exercise each week. ? You could do this in short exercise sessions several times a day, or you could do longer exercise sessions a few times a week. For example, you could take a brisk 10-minute walk or bike ride, 3 times a day, for 5 days a week.  Do not smoke. If you need help quitting, ask your health care provider.  Limit your alcohol intake. If you drink alcohol, limit alcohol intake to no more than 1 drink a day for nonpregnant women and 2 drinks a day for men. One drink equals 12 oz of beer, 5  oz of wine, or 1 oz of hard liquor. Why are these changes important? If you have high cholesterol, deposits (plaques) may build up on the walls of your blood vessels. Plaques make the arteries narrower and stiffer, which can restrict or block blood flow and cause blood clots to form. This greatly increases your risk for heart attack and stroke. Making diet and lifestyle changes can reduce your risk for these life-threatening conditions. What can I do to lower my risk?  Manage your risk factors for high cholesterol. Talk with your health care provider about all of your risk factors and how to lower your risk.  Manage other conditions that you have, such as diabetes or high blood pressure (hypertension).  Have your cholesterol checked at regular intervals.  Keep all follow-up visits as told by your health care  provider. This is important. How is this treated? In addition to diet and lifestyle changes, your health care provider may recommend medicines to help lower cholesterol, such as a medicine to reduce the amount of cholesterol made in your liver. You may need medicine if:  Diet and lifestyle changes do not lower your cholesterol enough.  You have high cholesterol and other risk factors for heart disease or stroke.  Take over-the-counter and prescription medicines only as told by your health care provider. Where to find more information:  American Heart Association: ThisTune.com.pt.jsp  National Heart, Lung, and Blood Institute: FrenchToiletries.com.cy Summary  High cholesterol increases your risk for heart disease and stroke. By keeping your cholesterol level low, you can reduce your risk for these conditions.  Diet and lifestyle changes are the most important steps in preventing high cholesterol.  Work with your health care provider to manage your risk factors, and have your blood tested regularly. This information is not intended to replace advice given to you by your health care provider. Make sure you discuss any questions you have with your health care provider. Document Released: 01/30/2015 Document Revised: 09/24/2015 Document Reviewed: 09/24/2015 Elsevier Interactive Patient Education  [employer].

## 2017-06-12 ENCOUNTER — Other Ambulatory Visit (INDEPENDENT_AMBULATORY_CARE_PROVIDER_SITE_OTHER): Payer: BLUE CROSS/BLUE SHIELD

## 2017-06-12 DIAGNOSIS — Z0000 Encounter for general adult medical examination without abnormal findings: Secondary | ICD-10-CM

## 2017-06-12 DIAGNOSIS — E782 Mixed hyperlipidemia: Secondary | ICD-10-CM

## 2017-06-12 DIAGNOSIS — Z131 Encounter for screening for diabetes mellitus: Secondary | ICD-10-CM | POA: Diagnosis not present

## 2017-06-12 LAB — LIPID PANEL
CHOL/HDL RATIO: 4
Cholesterol: 198 mg/dL (ref 0–200)
HDL: 54.9 mg/dL (ref >39.00)
LDL Cholesterol: 122 mg/dL — ABNORMAL HIGH (ref 0–99)
NonHDL: 143.07
Triglycerides: 104 mg/dL (ref 0.0–149.0)
VLDL: 20.8 mg/dL (ref 0.0–40.0)

## 2017-06-12 LAB — CBC WITH DIFFERENTIAL/PLATELET
BASOS ABS: 0 10*3/uL (ref 0.0–0.1)
Basophils Relative: 0.3 % (ref 0.0–3.0)
EOS ABS: 0 10*3/uL (ref 0.0–0.7)
Eosinophils Relative: 0.7 % (ref 0.0–5.0)
HCT: 42.4 % (ref 36.0–46.0)
Hemoglobin: 14.2 g/dL (ref 12.0–15.0)
LYMPHS ABS: 2 10*3/uL (ref 0.7–4.0)
LYMPHS PCT: 29.4 % (ref 12.0–46.0)
MCHC: 33.4 g/dL (ref 30.0–36.0)
MCV: 90.6 fl (ref 78.0–100.0)
Monocytes Absolute: 0.5 10*3/uL (ref 0.1–1.0)
Monocytes Relative: 7.4 % (ref 3.0–12.0)
NEUTROS PCT: 62.2 % (ref 43.0–77.0)
Neutro Abs: 4.2 10*3/uL (ref 1.4–7.7)
PLATELETS: 348 10*3/uL (ref 150.0–400.0)
RBC: 4.68 Mil/uL (ref 3.87–5.11)
RDW: 12.8 % (ref 11.5–15.5)
WBC: 6.7 10*3/uL (ref 4.0–10.5)

## 2017-06-12 LAB — BASIC METABOLIC PANEL
BUN: 18 mg/dL (ref 6–23)
CALCIUM: 9.3 mg/dL (ref 8.4–10.5)
CO2: 27 mEq/L (ref 19–32)
CREATININE: 0.87 mg/dL (ref 0.40–1.20)
Chloride: 104 mEq/L (ref 96–112)
GFR: 72.66 mL/min (ref >60.00)
GLUCOSE: 90 mg/dL (ref 70–99)
Potassium: 4.7 mEq/L (ref 3.5–5.1)
SODIUM: 139 meq/L (ref 135–145)

## 2017-06-12 LAB — HEMOGLOBIN A1C: HEMOGLOBIN A1C: 5.7 % (ref 4.6–6.5)

## 2017-06-18 ENCOUNTER — Other Ambulatory Visit: Payer: Self-pay | Admitting: Obstetrics & Gynecology

## 2017-06-18 NOTE — Telephone Encounter (Signed)
Former patient of Dr. Durenda Guthrie. At her CE last year 07/06/16 Dr. Moshe Salisbury wrote "Patient currently on Lescol as a result of her hyperlipidemia."  She is scheduled for annual exam with Dr. Marguerita Merles on 08/20/17.

## 2017-06-19 NOTE — Telephone Encounter (Signed)
I will represcribe until Annual/Gyn exam with me, but please recommend to establish with a Family MD.

## 2017-06-19 NOTE — Telephone Encounter (Signed)
I spoke with patient. This had come in on 05/28/17 and Anderson Malta had advised her same. We refilled it then and she has since seen Dr. Grier Mitts and had labs, etc and she will be PCP who will be filling this. I told patient I would deny with pharmacy and tell them to contact PCP name.

## 2017-06-26 ENCOUNTER — Telehealth: Payer: Self-pay | Admitting: Family Medicine

## 2017-06-26 NOTE — Telephone Encounter (Signed)
Please advise. This has previously been rx'd by her gyn.

## 2017-07-02 ENCOUNTER — Other Ambulatory Visit: Payer: Self-pay | Admitting: Family Medicine

## 2017-07-02 MED ORDER — FLUVASTATIN SODIUM ER 80 MG PO TB24: ORAL_TABLET | ORAL | 0 refills | Status: DC

## 2017-07-02 NOTE — Telephone Encounter (Signed)
Med was filled 06/27/2017 by PCP.  Called patient and pharmacy. Her insurance requires a 90 day supply and script was only sent for 30 days, so pharmacy has not been able to fill medication. New rx sent for 90 day supply. No further needs at this time.

## 2017-07-02 NOTE — Telephone Encounter (Signed)
Patient would like a call back from [place] or Dr. Volanda Napoleon because the Fluvastatin is for high cholesterol which should be managed by PCP and not GYN. Her GYN doctor told her she does manage high cholesterol.

## 2017-07-02 NOTE — Addendum Note (Signed)
Addended by: Dorrene German on: 07/02/2017 12:11 PM   Modules accepted: Orders

## 2017-07-18 ENCOUNTER — Other Ambulatory Visit: Payer: Self-pay | Admitting: Obstetrics & Gynecology

## 2017-08-20 ENCOUNTER — Ambulatory Visit (INDEPENDENT_AMBULATORY_CARE_PROVIDER_SITE_OTHER): Payer: BLUE CROSS/BLUE SHIELD | Admitting: Obstetrics & Gynecology

## 2017-08-20 ENCOUNTER — Encounter: Payer: Self-pay | Admitting: Obstetrics & Gynecology

## 2017-08-20 VITALS — BP 114/72 | Ht 65.25 in | Wt 155.2 lb

## 2017-08-20 DIAGNOSIS — Z01419 Encounter for gynecological examination (general) (routine) without abnormal findings: Secondary | ICD-10-CM

## 2017-08-20 DIAGNOSIS — Z79890 Hormone replacement therapy (postmenopausal): Secondary | ICD-10-CM

## 2017-08-20 MED ORDER — PROGESTERONE MICRONIZED 100 MG PO CAPS
100.0000 mg | ORAL_CAPSULE | Freq: Every day | ORAL | 4 refills | Status: DC
Start: 2017-08-20 — End: 2018-10-22

## 2017-08-20 MED ORDER — ESTRADIOL 0.0375 MG/24HR TD PTTW: 1.0000 | MEDICATED_PATCH | TRANSDERMAL | 4 refills | Status: DC

## 2017-08-20 NOTE — Patient Instructions (Signed)
1. Encounter for routine gynecological examination with Papanicolaou smear of cervix Normal gynecologic exam.  Pap reflex done today.  Breast exam normal.  Last screening mammogram was negative in November 2018.  Colonoscopy normal in 2015.  Health labs with family physician.  Hemoglobin A1c was at 5.7.  Patient on a low sugar diet.  Body mass index 25.63.  Planning to increase physical activity, recommend aerobic activities 5 times a week and weightlifting every 2 days.  2. Post-menopause on HRT (hormone replacement therapy) Patient doing well on hormone replacement therapy for about 3 years with good control of vasomotor menopausal symptoms.  On estradiol patch 0.0375 twice weekly.  Taking cyclic Provera which brings withdrawal bleeding that patient would prefer avoiding.  Also experiencing water retention on the Provera.  Decision to continue with the estradiol patch at the same dose, but to change the progestin from cyclic to continuous  with Prometrium 100 mg daily at bedtime.  Hopefully, she will no longer have uterine bleeding on that regimen.  Usage, benefits and risks of hormone replacement therapy reviewed with patient.  Prescription sent to pharmacy.  Recommend vitamin D supplements, calcium rich nutrition and regular weightbearing physical activity.  Will wait before starting screening bone densities.  Other orders - estradiol (VIVELLE-DOT) 0.0375 MG/24HR; Place 1 patch onto the skin 2 (two) times a week. - progesterone (PROMETRIUM) 100 MG capsule; Take 1 capsule (100 mg total) by mouth at bedtime.  Aniko, it was a pleasure meeting you today!  I will inform you of your results as soon as they are available.

## 2017-08-20 NOTE — Progress Notes (Signed)
Amanda Zuniga Weight December 02, 1965 [id number]   History:    52 y.o.  G4P2A2L2 Married.  Vasectomy  RP:  Established patient presenting for annual gyn exam   HPI:  Menopause, well on HRT x 3 yrs.  Vivelle dot 0.0375 Patch twice a week with Provera 10 mg day 1-12 every month.  Having light withdrawal bleeding which bothers her and water retention on the cyclic Progestin.  No pelvic pain.  No pain with IC.  Normal vaginal secretions.  Urine/BMs wnl.  Breasts wnl.  BMI 25.63.  Physically active with [place].  Health labs with Fam MD.  HBA1C was 5.7, on a low glucose diet now.  Past medical history,surgical history, family history and social history were all reviewed and documented in the EPIC chart.  Gynecologic History Patient's last menstrual period was 06/10/2016 (approximate). Contraception: post menopausal status and vasectomy Last Pap: 04/2014. Results were: Negative, HPV HR neg Last mammogram: 11/2016. Results were: Negative Bone Density: Never Colonoscopy: 2015 normal, 10 yr schedule  Obstetric History OB History  Gravida Para Term Preterm AB Living  4 2 1 1 2 2   SAB TAB Ectopic Multiple Live Births  1   1   2     # Outcome Date GA Lbr Len/2nd Weight Sex Delivery Anes PTL Lv  4 Ectopic           3 SAB           2 Preterm     M CS-Unspec  Y LIV  1 Term     M Vag-Spont  N LIV     ROS: A ROS was performed and pertinent positives and negatives are included in the history.  GENERAL: No fevers or chills. HEENT: No change in vision, no earache, sore throat or sinus congestion. NECK: No pain or stiffness. CARDIOVASCULAR: No chest pain or pressure. No palpitations. PULMONARY: No shortness of breath, cough or wheeze. GASTROINTESTINAL: No abdominal pain, nausea, vomiting or diarrhea, melena or bright red blood per rectum. GENITOURINARY: No urinary frequency, urgency, hesitancy or dysuria. MUSCULOSKELETAL: No joint or muscle pain, no back pain, no recent trauma. DERMATOLOGIC: No rash,  no itching, no lesions. ENDOCRINE: No polyuria, polydipsia, no heat or cold intolerance. No recent change in weight. HEMATOLOGICAL: No anemia or easy bruising or bleeding. NEUROLOGIC: No headache, seizures, numbness, tingling or weakness. PSYCHIATRIC: No depression, no loss of interest in normal activity or change in sleep pattern.     Exam:   BP 114/72   Ht 5' 5.25" (1.657 m)   Wt 155 lb 3.2 oz (70.4 kg)   LMP 06/10/2016 (Approximate)   BMI 25.63 kg/m   Body mass index is 25.63 kg/m.  General appearance : Well developed well nourished female. No acute distress HEENT: Eyes: no retinal hemorrhage or exudates,  Neck supple, trachea midline, no carotid bruits, no thyroidmegaly Lungs: Clear to auscultation, no rhonchi or wheezes, or rib retractions  Heart: Regular rate and rhythm, no murmurs or gallops Breast:Examined in sitting and supine position were symmetrical in appearance, no palpable masses or tenderness,  no skin retraction, no nipple inversion, no nipple discharge, no skin discoloration, no axillary or supraclavicular lymphadenopathy Abdomen: no palpable masses or tenderness, no rebound or guarding Extremities: no edema or skin discoloration or tenderness  Pelvic: Vulva: Normal             Vagina: No gross lesions or discharge  Cervix: No gross lesions or discharge.  Pap reflex done.  Uterus  AV, normal  size, shape and consistency, non-tender and mobile  Adnexa  Without masses or tenderness  Anus: Normal   Assessment/Plan:  52 y.o. female for annual exam   1. Encounter for routine gynecological examination with Papanicolaou smear of cervix Normal gynecologic exam.  Pap reflex done today.  Breast exam normal.  Last screening mammogram was negative in November 2018.  Colonoscopy normal in 2015.  Health labs with family physician.  Hemoglobin A1c was at 5.7.  Patient on a low sugar diet.  Body mass index 25.63.  Planning to increase physical activity, recommend aerobic  activities 5 times a week and weightlifting every 2 days.  2. Post-menopause on HRT (hormone replacement therapy) Patient doing well on hormone replacement therapy for about 3 years with good control of vasomotor menopausal symptoms.  On estradiol patch 0.0375 twice weekly.  Taking cyclic Provera which brings withdrawal bleeding that patient would prefer avoiding.  Also experiencing water retention on the Provera.  Decision to continue with the estradiol patch at the same dose, but to change the progestin from cyclic to continuous  with Prometrium 100 mg daily at bedtime.  Hopefully, she will no longer have uterine bleeding on that regimen.  Usage, benefits and risks of hormone replacement therapy reviewed with patient.  Prescription sent to pharmacy.  Recommend vitamin D supplements, calcium rich nutrition and regular weightbearing physical activity.  Will wait before starting screening bone densities.  Other orders - estradiol (VIVELLE-DOT) 0.0375 MG/24HR; Place 1 patch onto the skin 2 (two) times a week. - progesterone (PROMETRIUM) 100 MG capsule; Take 1 capsule (100 mg total) by mouth at bedtime.  Princess Bruins MD, 4:00 PM 08/20/2017

## 2017-08-21 LAB — PAP IG W/ RFLX HPV ASCU

## 2017-09-28 ENCOUNTER — Other Ambulatory Visit: Payer: Self-pay | Admitting: Family Medicine

## 2017-11-20 ENCOUNTER — Other Ambulatory Visit: Payer: Self-pay | Admitting: Obstetrics & Gynecology

## 2017-11-20 DIAGNOSIS — Z1231 Encounter for screening mammogram for malignant neoplasm of breast: Secondary | ICD-10-CM

## 2017-12-01 ENCOUNTER — Other Ambulatory Visit: Payer: Self-pay | Admitting: Obstetrics & Gynecology

## 2017-12-02 NOTE — Telephone Encounter (Signed)
Rx called in 

## 2017-12-02 NOTE — Telephone Encounter (Signed)
Last filled on 04/2017 with 2 refills

## 2017-12-20 ENCOUNTER — Other Ambulatory Visit: Payer: Self-pay | Admitting: Family Medicine

## 2018-01-01 ENCOUNTER — Ambulatory Visit
Admission: RE | Admit: 2018-01-01 | Discharge: 2018-01-01 | Disposition: A | Discharge status: Home / Self Care | Payer: BLUE CROSS/BLUE SHIELD | Source: Ambulatory Visit | Attending: Obstetrics & Gynecology | Admitting: Obstetrics & Gynecology

## 2018-01-01 DIAGNOSIS — Z1231 Encounter for screening mammogram for malignant neoplasm of breast: Secondary | ICD-10-CM

## 2018-03-18 ENCOUNTER — Other Ambulatory Visit: Payer: Self-pay | Admitting: Family Medicine

## 2018-03-18 NOTE — Telephone Encounter (Signed)
Pt needs an appointment for further refills  

## 2018-06-19 ENCOUNTER — Other Ambulatory Visit: Payer: Self-pay | Admitting: Obstetrics & Gynecology

## 2018-06-19 NOTE — Telephone Encounter (Signed)
Called into pharmacy

## 2018-06-21 ENCOUNTER — Other Ambulatory Visit: Payer: Self-pay | Admitting: Family Medicine

## 2018-06-24 ENCOUNTER — Other Ambulatory Visit: Payer: Self-pay

## 2018-06-24 ENCOUNTER — Encounter: Payer: Self-pay | Admitting: Family Medicine

## 2018-06-24 ENCOUNTER — Ambulatory Visit (INDEPENDENT_AMBULATORY_CARE_PROVIDER_SITE_OTHER): Payer: BLUE CROSS/BLUE SHIELD | Admitting: Family Medicine

## 2018-06-24 DIAGNOSIS — Z833 Family history of diabetes mellitus: Secondary | ICD-10-CM | POA: Diagnosis not present

## 2018-06-24 DIAGNOSIS — Z131 Encounter for screening for diabetes mellitus: Secondary | ICD-10-CM

## 2018-06-24 DIAGNOSIS — E782 Mixed hyperlipidemia: Secondary | ICD-10-CM | POA: Diagnosis not present

## 2018-06-24 MED ORDER — FLUVASTATIN SODIUM ER 80 MG PO TB24: ORAL_TABLET | ORAL | 2 refills | Status: DC

## 2018-06-24 NOTE — Telephone Encounter (Signed)
Pt has a appointment virtually for med refill

## 2018-06-24 NOTE — Progress Notes (Signed)
Virtual Visit via Video Note  I connected with Amanda Zuniga on 06/24/18 at  4:00 PM EDT by a video enabled telemedicine application and verified that I am speaking with the correct person using two identifiers.  Location patient: home Location provider:work or home office Persons participating in the virtual visit: patient, provider  I discussed the limitations of evaluation and management by telemedicine and the availability of in person appointments. The patient expressed understanding and agreed to proceed.   HPI: Pt states she is doing well.  Needs fluvistatin refill. Taking med 3x/wk.  Denies myalgias.  Would like to have her cholesterol rechecked.  States concerned about her Hgb A1C level as was 5.7% last yr.  Pt has DM in the family so wants to "keep up with it".  When found out she was borderline normal/prediabetic she started eating better, lost 7 lbs.  Pt states she slowly went back to some of her old eating habits and has gained 5 lbs back.      ROS: See pertinent positives and negatives per HPI.  Past Medical History:  Diagnosis Date  . Anxiety   . BCC (basal cell carcinoma of skin)    chest and back and right leg  . CIN I (cervical intraepithelial neoplasia I)   . Ectopic pregnancy    HISTORY OF ECTOPIC/METHOTREXATE  . NSVD (normal spontaneous vaginal delivery)    macrosomia    Past Surgical History:  Procedure Laterality Date  . ABLATION ON ENDOMETRIOSIS N/A 07/01/2013   Procedure: Fulguration of ENDOMETRIOSIS;  Surgeon: Terrance Mass, MD;  Location: [facility];  Service: Gynecology;  Laterality: N/A;  . CERVICAL BIOPSY  W/ LOOP ELECTRODE EXCISION    . CESAREAN SECTION  2004   33 weeks-hyprops, fetal tachycardia  . COLONOSCOPY     2016  . DILATION AND CURETTAGE OF UTERUS  1997   miscarriage  . LAPAROSCOPY Bilateral 07/01/2013   Procedure: LAPAROSCOPY OPERATIVE/BILATERAL OVARIAN CYSTECTOMY;  Surgeon: Terrance Mass, MD;  Location: [facility];  Service: Gynecology;   Laterality: Bilateral;  bilateral ovarian cystectomy     Family History  Problem Relation Age of Onset  . Bladder Cancer Mother   . Heart disease Mother        MI  . Bladder Cancer Maternal Grandmother   . Colon cancer Neg Hx   . Esophageal cancer Neg Hx   . Rectal cancer Neg Hx   . Stomach cancer Neg Hx     SOCIAL HX:    Current Outpatient Medications:  .  ALPRAZolam (XANAX) 0.25 MG tablet, TAKE 1 TABLET BY MOUTH EVERY DAY AT BEDTIME AS NEEDED FOR SLEEP, Disp: 30 tablet, Rfl: 1 .  estradiol (VIVELLE-DOT) 0.0375 MG/24HR, Place 1 patch onto the skin 2 (two) times a week., Disp: 24 patch, Rfl: 4 .  famotidine (PEPCID) 10 MG tablet, Take 10 mg by mouth daily as needed for heartburn., Disp: , Rfl:  .  fluvastatin XL (LESCOL XL) 80 MG 24 hr tablet, TAKE 1 TABLET BY MOUTH 3 TIMES A WEEK, Disp: 36 tablet, Rfl: 0 .  nitrofurantoin, macrocrystal-monohydrate, (MACROBID) 100 MG capsule, TAKE 1 CAPSULE BY MOUTH AS DIRECTED WHEN NECESSARY (Patient not taking: Reported on 07/02/2016), Disp: 30 capsule, Rfl: 5 .  progesterone (PROMETRIUM) 100 MG capsule, Take 1 capsule (100 mg total) by mouth at bedtime., Disp: 90 capsule, Rfl: 4  EXAM:  VITALS per patient if applicable:  RR between 12-20 bpm  GENERAL: alert, oriented, appears well and in no acute  distress  HEENT: atraumatic, conjunctiva clear, no obvious abnormalities on inspection of external nose and ears  NECK: normal movements of the head and neck  LUNGS: on inspection no signs of respiratory distress, breathing rate appears normal, no obvious gross SOB, gasping or wheezing  CV: no obvious cyanosis  MS: moves all visible extremities without noticeable abnormality  PSYCH/NEURO: pleasant and cooperative, no obvious depression or anxiety, speech and thought processing grossly intact  ASSESSMENT AND PLAN:  Discussed the following assessment and plan:  Mixed hyperlipidemia  -discussed lifestyle modifications -continue fluvastatin  3x/wk - Plan: Lipid panel, fluvastatin XL (LESCOL XL) 80 MG 24 hr tablet  Family history of diabetes mellitus  - Plan: Hemoglobin A1c  Screening for diabetes mellitus  -discussed lifestyle modifications - Plan: Hemoglobin A1c  F/u prn.  Pt to have labs done in the next few wks.   I discussed the assessment and treatment plan with the patient. The patient was provided an opportunity to ask questions and all were answered. The patient agreed with the plan and demonstrated an understanding of the instructions.   The patient was advised to call back or seek an in-person evaluation if the symptoms worsen or if the condition fails to improve as anticipated.   Billie Ruddy, MD

## 2018-07-11 ENCOUNTER — Other Ambulatory Visit: Payer: Self-pay

## 2018-07-11 ENCOUNTER — Other Ambulatory Visit (INDEPENDENT_AMBULATORY_CARE_PROVIDER_SITE_OTHER): Payer: BC Managed Care – PPO

## 2018-07-11 DIAGNOSIS — E782 Mixed hyperlipidemia: Secondary | ICD-10-CM | POA: Diagnosis not present

## 2018-07-11 DIAGNOSIS — Z833 Family history of diabetes mellitus: Secondary | ICD-10-CM | POA: Diagnosis not present

## 2018-07-11 DIAGNOSIS — Z131 Encounter for screening for diabetes mellitus: Secondary | ICD-10-CM

## 2018-07-11 LAB — LIPID PANEL
Cholesterol: 229 mg/dL — ABNORMAL HIGH (ref 0–200)
HDL: 55.4 mg/dL (ref >39.00)
LDL Cholesterol: 139 mg/dL — ABNORMAL HIGH (ref 0–99)
NonHDL: 173.22
Total CHOL/HDL Ratio: 4
Triglycerides: 171 mg/dL — ABNORMAL HIGH (ref 0.0–149.0)
VLDL: 34.2 mg/dL (ref 0.0–40.0)

## 2018-07-11 LAB — HEMOGLOBIN A1C: Hgb A1c MFr Bld: 5.8 % (ref 4.6–6.5)

## 2018-07-28 ENCOUNTER — Telehealth: Payer: Self-pay

## 2018-07-28 ENCOUNTER — Other Ambulatory Visit: Payer: Self-pay | Admitting: Obstetrics & Gynecology

## 2018-07-28 DIAGNOSIS — N950 Postmenopausal bleeding: Secondary | ICD-10-CM

## 2018-07-28 NOTE — Telephone Encounter (Signed)
Recommend stopping HRT now.  Schedule Pelvic US with me.  Will decide on management based on findings.

## 2018-07-28 NOTE — Telephone Encounter (Signed)
HRT had been fine until this past month she started with hot flashes and mood swings. Then this weekend "had a period" and has not bled in a year.

## 2018-07-28 NOTE — Telephone Encounter (Signed)
Spoke with patient and advised her to d/c HRT and schedule u/s and visit with ML. appt scheduled.

## 2018-08-11 ENCOUNTER — Other Ambulatory Visit: Payer: Self-pay

## 2018-08-12 ENCOUNTER — Encounter: Payer: Self-pay | Admitting: Obstetrics & Gynecology

## 2018-08-12 ENCOUNTER — Ambulatory Visit (INDEPENDENT_AMBULATORY_CARE_PROVIDER_SITE_OTHER): Payer: BC Managed Care – PPO | Admitting: Obstetrics & Gynecology

## 2018-08-12 ENCOUNTER — Ambulatory Visit (INDEPENDENT_AMBULATORY_CARE_PROVIDER_SITE_OTHER): Payer: BC Managed Care – PPO

## 2018-08-12 DIAGNOSIS — Z79890 Hormone replacement therapy (postmenopausal): Secondary | ICD-10-CM | POA: Diagnosis not present

## 2018-08-12 DIAGNOSIS — N950 Postmenopausal bleeding: Secondary | ICD-10-CM | POA: Diagnosis not present

## 2018-08-12 NOTE — Progress Notes (Signed)
    Amanda Zuniga October 13, 1965 [id number]        53 y.o.  [id number] Married.  Vasectomy.  RP: PMB on HRT at the end of 06/2018 for Pelvic US  HPI: Patient was on estradiol patch 0.0375 with Prometrium 100 mg at bedtime, had a menstrual like type of bleeding at the end of June 2020.  She was also experiencing moodiness and breast tenderness at that time.  We recommended stopping hormone replacement therapy on July 28, 2018, which she did.  No recurrence of postmenopausal bleeding since then.  No pelvic pain.  No vasomotor symptoms of menopause currently.  Feels better without hormone replacement therapy.   OB History  Gravida Para Term Preterm AB Living  4 2 1 1 2 2   SAB TAB Ectopic Multiple Live Births  1   1   2     # Outcome Date GA Lbr Len/2nd Weight Sex Delivery Anes PTL Lv  4 Ectopic           3 SAB           2 Preterm     M CS-Unspec  Y LIV  1 Term     M Vag-Spont  N LIV    Past medical history,surgical history, problem list, medications, allergies, family history and social history were all reviewed and documented in the EPIC chart.   Directed ROS with pertinent positives and negatives documented in the history of present illness/assessment and plan.  Exam:  There were no vitals filed for this visit. General appearance:  Normal  Pelvic US today: T/A and T/V images.  Anteverted uterus measuring 7.04 x 4.65 x 3.98 cm.  Normal size and shape with no myometrial mass.  Thin symmetrical endometrial lining measuring 2.8 mm with no mass seen.  Neither ovary is positively identified, but no adnexal mass seen abdominally or vaginally.  No free fluid in the posterior cul-de-sac.   Assessment/Plan:  53 y.o. [id number]   1. Post-menopausal bleeding 1 episode of postmenopausal bleeding on hormone replacement therapy in June 2020.  Well on no hormone replacement therapy since then with no recurrence of postmenopausal bleeding.  Pelvic ultrasound today reviewed with patient and  completely reassuring.  Normal uterus with a thin endometrial lining at 2.8 mm.  Patient reassured.  2. Post-menopause on HRT (hormone replacement therapy) Decision not to restart on hormone replacement therapy.  Will continue on calcium intake of 1200 mg daily with vitamin D supplements and regular weightbearing physical activities.  Counseling on above issues and coordination of care more than 50% for 15 minutes.  Princess Bruins MD, 8:45 AM 08/12/2018

## 2018-08-12 NOTE — Patient Instructions (Signed)
1. Post-menopausal bleeding 1 episode of postmenopausal bleeding on hormone replacement therapy in June 2020.  Well on no hormone replacement therapy since then with no recurrence of postmenopausal bleeding.  Pelvic ultrasound today reviewed with patient and completely reassuring.  Normal uterus with a thin endometrial lining at 2.8 mm.  Patient reassured.  2. Post-menopause on HRT (hormone replacement therapy) Decision not to restart on hormone replacement therapy.  Will continue on calcium intake of 1200 mg daily with vitamin D supplements and regular weightbearing physical activities.  Amanda Zuniga, it was a pleasure seeing you today!

## 2018-08-21 ENCOUNTER — Other Ambulatory Visit: Payer: Self-pay

## 2018-08-22 ENCOUNTER — Encounter: Payer: BC Managed Care – PPO | Admitting: Obstetrics & Gynecology

## 2018-09-22 ENCOUNTER — Telehealth: Payer: Self-pay | Admitting: *Deleted

## 2018-09-22 NOTE — Telephone Encounter (Signed)
Patient called has been off HRT since 07/28/18, called today c/o hot flashes, night sweats, anxiety asked if she could restart HRT? Patient was on estradiol patch 0.0375 with Prometrium 100 mg at bedtime. Please advise

## 2018-09-23 NOTE — Telephone Encounter (Signed)
Yes, agree to restart on same HRT.

## 2018-09-23 NOTE — Telephone Encounter (Signed)
Patient will restart HRT has annual exam scheduled on 10/22/18

## 2018-10-21 ENCOUNTER — Other Ambulatory Visit: Payer: Self-pay

## 2018-10-22 ENCOUNTER — Ambulatory Visit (INDEPENDENT_AMBULATORY_CARE_PROVIDER_SITE_OTHER): Payer: BC Managed Care – PPO | Admitting: Obstetrics & Gynecology

## 2018-10-22 ENCOUNTER — Encounter: Payer: Self-pay | Admitting: Obstetrics & Gynecology

## 2018-10-22 VITALS — BP 126/80 | Ht 65.5 in | Wt 156.0 lb

## 2018-10-22 DIAGNOSIS — Z79890 Hormone replacement therapy (postmenopausal): Secondary | ICD-10-CM | POA: Diagnosis not present

## 2018-10-22 DIAGNOSIS — Z23 Encounter for immunization: Secondary | ICD-10-CM | POA: Diagnosis not present

## 2018-10-22 DIAGNOSIS — Z01419 Encounter for gynecological examination (general) (routine) without abnormal findings: Secondary | ICD-10-CM | POA: Diagnosis not present

## 2018-10-22 MED ORDER — ESTRADIOL 0.0375 MG/24HR TD PTTW: 1.0000 | MEDICATED_PATCH | TRANSDERMAL | 4 refills | Status: DC

## 2018-10-22 MED ORDER — PROGESTERONE MICRONIZED 100 MG PO CAPS: 100.0000 mg | ORAL_CAPSULE | Freq: Every day | ORAL | 4 refills | Status: DC

## 2018-10-22 NOTE — Progress Notes (Signed)
Amanda Zuniga Oct 17, 1965 [id number]   History:    53 y.o. G4P2A2L2 Married.  Vasectomy.  53 yo and a teenager, both doing well.  RP:  Established patient presenting for annual gyn exam   HPI: Postmenopausal, well on hormone replacement therapy with estradiol patch 0.0375 twice a week and Prometrium 100 mg at bedtime.  Had a menstrual period in June, pelvic ultrasound July 2020 showed a thin normal endometrial lining.  Restarted hormone replacement therapy mid August 2020 and has not had any vaginal bleeding since then.  No pelvic pain.  No pain with intercourse.  Urine and bowel movements normal.  Breast normal.  Body mass index 25.56.  Good fitness and healthy nutrition.  Health labs with family physician.  Hypercholesterolemia on Fluvastatin XL.  Past medical history,surgical history, family history and social history were all reviewed and documented in the EPIC chart.  Gynecologic History Patient's last menstrual period was 06/10/2016 (approximate). Contraception: post menopausal status and vasectomy Last Pap: 07/2017. Results were: Negative Last mammogram: 12/2017. Results were: Negative Bone Density: Never Colonoscopy: 2015  Obstetric History OB History  Gravida Para Term Preterm AB Living  4 2 1 1 2 2   SAB TAB Ectopic Multiple Live Births  1   1   2     # Outcome Date GA Lbr Len/2nd Weight Sex Delivery Anes PTL Lv  4 Ectopic           3 SAB           2 Preterm     M CS-Unspec  Y LIV  1 Term     M Vag-Spont  N LIV     ROS: A ROS was performed and pertinent positives and negatives are included in the history.  GENERAL: No fevers or chills. HEENT: No change in vision, no earache, sore throat or sinus congestion. NECK: No pain or stiffness. CARDIOVASCULAR: No chest pain or pressure. No palpitations. PULMONARY: No shortness of breath, cough or wheeze. GASTROINTESTINAL: No abdominal pain, nausea, vomiting or diarrhea, melena or bright red blood per rectum. GENITOURINARY:  No urinary frequency, urgency, hesitancy or dysuria. MUSCULOSKELETAL: No joint or muscle pain, no back pain, no recent trauma. DERMATOLOGIC: No rash, no itching, no lesions. ENDOCRINE: No polyuria, polydipsia, no heat or cold intolerance. No recent change in weight. HEMATOLOGICAL: No anemia or easy bruising or bleeding. NEUROLOGIC: No headache, seizures, numbness, tingling or weakness. PSYCHIATRIC: No depression, no loss of interest in normal activity or change in sleep pattern.     Exam:   BP 126/80   Ht 5' 5.5" (1.664 m)   Wt 156 lb (70.8 kg)   LMP 06/10/2016 (Approximate)   BMI 25.56 kg/m   Body mass index is 25.56 kg/m.  General appearance : Well developed well nourished female. No acute distress HEENT: Eyes: no retinal hemorrhage or exudates,  Neck supple, trachea midline, no carotid bruits, no thyroidmegaly Lungs: Clear to auscultation, no rhonchi or wheezes, or rib retractions  Heart: Regular rate and rhythm, no murmurs or gallops Breast:Examined in sitting and supine position were symmetrical in appearance, no palpable masses or tenderness,  no skin retraction, no nipple inversion, no nipple discharge, no skin discoloration, no axillary or supraclavicular lymphadenopathy Abdomen: no palpable masses or tenderness, no rebound or guarding Extremities: no edema or skin discoloration or tenderness  Pelvic: Vulva: Normal             Vagina: No gross lesions or discharge  Cervix: No gross lesions or discharge  Uterus  AV, normal size, shape and consistency, non-tender and mobile  Adnexa  Without masses or tenderness  Anus: Normal   Assessment/Plan:  53 y.o. female for annual exam   1. Well female exam with routine gynecological exam Normal gynecologic exam in menopause.  Pap test negative July 2019, will repeat next year.  Breast exam normal.  Screening mammogram December 2019 was negative.  Health labs with family physician.  Treated for hypercholesterolemia.  Good body mass  index at 25.56.  Continue with fitness and healthy nutrition.  2. Postmenopausal hormone replacement therapy Well on hormone replacement therapy.  No contraindication to continue.  Estradiol 0.0375 twice a week and progesterone 100 mg at bedtime represcribed.  Recommend vitamin D supplements, calcium intake of 1200 mg daily and regular weightbearing physical activities.  Other orders - estradiol (VIVELLE-DOT) 0.0375 MG/24HR; Place 1 patch onto the skin 2 (two) times a week. - progesterone (PROMETRIUM) 100 MG capsule; Take 1 capsule (100 mg total) by mouth at bedtime.  Princess Bruins MD, 11:11 AM 10/22/2018

## 2018-10-22 NOTE — Addendum Note (Signed)
Addended by: Thurnell Garbe A on: 10/22/2018 12:42 PM   Modules accepted: Orders

## 2018-10-22 NOTE — Patient Instructions (Signed)
1. Well female exam with routine gynecological exam Normal gynecologic exam in menopause.  Pap test negative July 2019, will repeat next year.  Breast exam normal.  Screening mammogram December 2019 was negative.  Health labs with family physician.  Treated for hypercholesterolemia.  Good body mass index at 25.56.  Continue with fitness and healthy nutrition.  2. Postmenopausal hormone replacement therapy Well on hormone replacement therapy.  No contraindication to continue.  Estradiol 0.0375 twice a week and progesterone 100 mg at bedtime represcribed.  Recommend vitamin D supplements, calcium intake of 1200 mg daily and regular weightbearing physical activities.  Other orders - estradiol (VIVELLE-DOT) 0.0375 MG/24HR; Place 1 patch onto the skin 2 (two) times a week. - progesterone (PROMETRIUM) 100 MG capsule; Take 1 capsule (100 mg total) by mouth at bedtime.  Mirella, it was a pleasure seeing you today!

## 2018-10-23 ENCOUNTER — Other Ambulatory Visit: Payer: Self-pay

## 2018-10-23 NOTE — Telephone Encounter (Signed)
Patient said when she was in yesterday she asked you about refills. She said the HRT refills were sent but she needs refill on Alprazolam 0.25 mg.

## 2018-10-24 MED ORDER — ALPRAZOLAM 0.25 MG PO TABS: ORAL_TABLET | ORAL | 2 refills | Status: DC

## 2018-12-02 ENCOUNTER — Other Ambulatory Visit: Payer: Self-pay | Admitting: Obstetrics & Gynecology

## 2018-12-02 DIAGNOSIS — Z1231 Encounter for screening mammogram for malignant neoplasm of breast: Secondary | ICD-10-CM

## 2019-01-26 ENCOUNTER — Other Ambulatory Visit: Payer: Self-pay

## 2019-01-26 ENCOUNTER — Ambulatory Visit
Admission: RE | Admit: 2019-01-26 | Discharge: 2019-01-26 | Disposition: A | Discharge status: Home / Self Care | Payer: BC Managed Care – PPO | Source: Ambulatory Visit | Attending: Obstetrics & Gynecology | Admitting: Obstetrics & Gynecology

## 2019-01-26 DIAGNOSIS — Z1231 Encounter for screening mammogram for malignant neoplasm of breast: Secondary | ICD-10-CM

## 2019-03-27 ENCOUNTER — Other Ambulatory Visit: Payer: Self-pay | Admitting: Obstetrics & Gynecology

## 2019-06-22 ENCOUNTER — Other Ambulatory Visit: Payer: Self-pay | Admitting: Obstetrics & Gynecology

## 2019-06-22 NOTE — Telephone Encounter (Signed)
Last filled on 03/28/19 #30 with 1 refill

## 2019-08-19 ENCOUNTER — Other Ambulatory Visit: Payer: Self-pay

## 2019-08-19 ENCOUNTER — Encounter: Payer: Self-pay | Admitting: Obstetrics & Gynecology

## 2019-08-19 ENCOUNTER — Ambulatory Visit (INDEPENDENT_AMBULATORY_CARE_PROVIDER_SITE_OTHER): Payer: BC Managed Care – PPO | Admitting: Obstetrics & Gynecology

## 2019-08-19 VITALS — BP 120/80

## 2019-08-19 DIAGNOSIS — Z79890 Hormone replacement therapy (postmenopausal): Secondary | ICD-10-CM | POA: Diagnosis not present

## 2019-08-19 DIAGNOSIS — N950 Postmenopausal bleeding: Secondary | ICD-10-CM | POA: Diagnosis not present

## 2019-08-19 NOTE — Progress Notes (Signed)
    Amanda Zuniga [birth year] [id number]        54 y.o.  [id number]   RP: PMB in early July, on HRT  HPI: Had 4 days of moderate bleeding at the beginning of July which followed 2 weeks of PMS symptoms including breast tenderness and pelvic pressure.  Has been in menopause for many years and well on hormone replacement therapy which she took as usual during that period with estradiol patch 0.0375 and Prometrium 100 mg at bedtime.  Usually not having any bleeding on hormone replacement therapy.  No current pelvic pain.  Normal vaginal secretions.  Urine and bowel movements normal.   OB History  Gravida Para Term Preterm AB Living  4 2 1 1 2 2   SAB TAB Ectopic Multiple Live Births  1   1   2     # Outcome Date GA Lbr Len/2nd Weight Sex Delivery Anes PTL Lv  4 Ectopic           3 SAB           2 Preterm     M CS-Unspec  Y LIV  1 Term     M Vag-Spont  N LIV    Past medical history,surgical history, problem list, medications, allergies, family history and social history were all reviewed and documented in the EPIC chart.   Directed ROS with pertinent positives and negatives documented in the history of present illness/assessment and plan.  Exam:  Vitals:   08/19/19 1126  BP: 120/80   General appearance:  Normal  Abdomen: Normal  Gynecologic exam: Vulva normal.  Speculum:  Cervix/Vagina normal.  No blood.  Normal secretions.  Bimanual exam: Uterus anteverted, normal volume, mobile, nontender.  No adnexal mass, nontender bilaterally.   Assessment/Plan:  54 y.o. [id number]   1. Postmenopausal bleeding Postmenopausal bleeding resembling a menstrual period, preceded by PMS for 2 weeks.  Rule out abnormal bleeding caused by endometrial polyps, fibroids, endometrial hyperplasia and endometrial cancer.  We will follow-up for a pelvic ultrasound to investigate.   - US Transvaginal Non-OB; Future  2. Postmenopausal hormone replacement therapy Prefers to continue hormone replacement  therapy the same.  Princess Bruins MD, 11:48 AM 08/19/2019

## 2019-08-30 ENCOUNTER — Other Ambulatory Visit: Payer: Self-pay | Admitting: Family Medicine

## 2019-08-30 DIAGNOSIS — E782 Mixed hyperlipidemia: Secondary | ICD-10-CM

## 2019-08-31 NOTE — Telephone Encounter (Signed)
Pt needs appointment for further refills 

## 2019-09-03 ENCOUNTER — Encounter: Payer: Self-pay | Admitting: Obstetrics & Gynecology

## 2019-09-03 ENCOUNTER — Ambulatory Visit (INDEPENDENT_AMBULATORY_CARE_PROVIDER_SITE_OTHER): Payer: BC Managed Care – PPO

## 2019-09-03 ENCOUNTER — Ambulatory Visit (INDEPENDENT_AMBULATORY_CARE_PROVIDER_SITE_OTHER): Payer: BC Managed Care – PPO | Admitting: Obstetrics & Gynecology

## 2019-09-03 ENCOUNTER — Other Ambulatory Visit: Payer: Self-pay

## 2019-09-03 VITALS — BP 120/82

## 2019-09-03 DIAGNOSIS — N950 Postmenopausal bleeding: Secondary | ICD-10-CM

## 2019-09-03 DIAGNOSIS — N854 Malposition of uterus: Secondary | ICD-10-CM

## 2019-09-03 DIAGNOSIS — Z79890 Hormone replacement therapy (postmenopausal): Secondary | ICD-10-CM | POA: Diagnosis not present

## 2019-09-03 NOTE — Progress Notes (Signed)
    Amanda Zuniga January 01, 1966 [id number]        54 y.o.  [id number]   RP: PMB early July 2021 on HRT for Pelvic US  HPI: Had a menstrual like episode early July 2021 on HRT.  PMS present, then 4 days of light flow vaginal bleeding.  Husband vasectomized.  No vaginal bleeding since then.  Well on HRT.  No pelvic pain.  Urine/BMs normal.   OB History  Gravida Para Term Preterm AB Living  4 2 1 1 2 2   SAB TAB Ectopic Multiple Live Births  1   1   2     # Outcome Date GA Lbr Len/2nd Weight Sex Delivery Anes PTL Lv  4 Ectopic           3 SAB           2 Preterm     M CS-Unspec  Y LIV  1 Term     M Vag-Spont  N LIV    Past medical history,surgical history, problem list, medications, allergies, family history and social history were all reviewed and documented in the EPIC chart.   Directed ROS with pertinent positives and negatives documented in the history of present illness/assessment and plan.  Exam:  Vitals:   09/03/19 0945  BP: 120/82   General appearance:  Normal  Pelvic US here today: T/V images.  Anteverted uterus normal in size and shape with no myometrial mass.  The uterus is measured at 6.65 x 3.95 x 3.47 cm.  The endometrial lining is thin and symmetrical with no mass or thickening seen.  The endometrial lining is measured at 3.27 mm.  Right ovary seen with a small cyst with internal echoes suggestive of a corpus luteum cyst measured at 1.2 x 1.4 cm.  Difficult visualization of the left ovary which is seen transabdominally only.  No adnexal mass bilaterally.  No free fluid in the posterior cul-de-sac.    Assessment/Plan:  54 y.o. [id number]   1. Postmenopausal bleeding Postmenopausal bleeding probably corresponding to an isolated ovulation with a menstrual.  Early July 2021.  Pelvic ultrasound findings thoroughly reviewed with patient.  The uterus is normal, the endometrial lining is thin and normal at 3.27 mm.  Ovaries are normal with a probable small corpus luteum on  the right ovary.  Patient reassured given the thin normal endometrial lining.  2. Postmenopausal hormone replacement therapy Prefers to continue on hormone replacement therapy.  Precautions discussed.  Princess Bruins MD, 9:50 AM 09/03/2019

## 2019-10-23 ENCOUNTER — Encounter: Payer: BC Managed Care – PPO | Admitting: Obstetrics & Gynecology

## 2019-10-26 ENCOUNTER — Other Ambulatory Visit: Payer: Self-pay

## 2019-10-26 ENCOUNTER — Ambulatory Visit (INDEPENDENT_AMBULATORY_CARE_PROVIDER_SITE_OTHER): Payer: BC Managed Care – PPO | Admitting: Obstetrics & Gynecology

## 2019-10-26 ENCOUNTER — Encounter: Payer: Self-pay | Admitting: Obstetrics & Gynecology

## 2019-10-26 VITALS — BP 124/80 | Ht 65.0 in | Wt 154.0 lb

## 2019-10-26 DIAGNOSIS — Z79890 Hormone replacement therapy (postmenopausal): Secondary | ICD-10-CM

## 2019-10-26 DIAGNOSIS — Z01419 Encounter for gynecological examination (general) (routine) without abnormal findings: Secondary | ICD-10-CM

## 2019-10-26 MED ORDER — PROGESTERONE MICRONIZED 100 MG PO CAPS: 100.0000 mg | ORAL_CAPSULE | Freq: Every day | ORAL | 4 refills | Status: DC

## 2019-10-26 MED ORDER — ESTRADIOL 0.0375 MG/24HR TD PTTW
1.0000 | MEDICATED_PATCH | TRANSDERMAL | 4 refills | Status: DC
Start: 2019-10-26 — End: 2020-12-02

## 2019-10-26 NOTE — Progress Notes (Signed)
Amanda Zuniga 1965-02-06 [id number]   History:    54 y.o. G4P2A2L2 Married.  Vasectomy.  54 yo and a teenager, both doing well.  RP:  Established patient presenting for annual gyn exam   HPI: Postmenopausal, well on hormone replacement therapy with estradiol patch 0.0375 twice a week and Prometrium 100 mg at bedtime.  Had PMB 07/2019, pelvic ultrasound 09/03/2019 showed a thin normal endometrial lining at 3.27 mm.  Restarted hormone replacement therapy mid August 2021 and has not had any vaginal bleeding since then.  No pelvic pain.  No pain with intercourse. Urine and bowel movements normal.  Breast normal.  Body mass index 25.63.  Good fitness and healthy nutrition.  Health labs with family physician.  Hypercholesterolemia on Fluvastatin XL.  Past medical history,surgical history, family history and social history were all reviewed and documented in the EPIC chart.  Gynecologic History Patient's last menstrual period was 06/10/2016 (approximate).  Obstetric History OB History  Gravida Para Term Preterm AB Living  4 2 1 1 2 2   SAB TAB Ectopic Multiple Live Births  1   1   2     # Outcome Date GA Lbr Len/2nd Weight Sex Delivery Anes PTL Lv  4 Ectopic           3 SAB           2 Preterm     M CS-Unspec  Y LIV  1 Term     M Vag-Spont  N LIV     ROS: A ROS was performed and pertinent positives and negatives are included in the history.  GENERAL: No fevers or chills. HEENT: No change in vision, no earache, sore throat or sinus congestion. NECK: No pain or stiffness. CARDIOVASCULAR: No chest pain or pressure. No palpitations. PULMONARY: No shortness of breath, cough or wheeze. GASTROINTESTINAL: No abdominal pain, nausea, vomiting or diarrhea, melena or bright red blood per rectum. GENITOURINARY: No urinary frequency, urgency, hesitancy or dysuria. MUSCULOSKELETAL: No joint or muscle pain, no back pain, no recent trauma. DERMATOLOGIC: No rash, no itching, no lesions. ENDOCRINE: No  polyuria, polydipsia, no heat or cold intolerance. No recent change in weight. HEMATOLOGICAL: No anemia or easy bruising or bleeding. NEUROLOGIC: No headache, seizures, numbness, tingling or weakness. PSYCHIATRIC: No depression, no loss of interest in normal activity or change in sleep pattern.     Exam:   BP 124/80 (BP Location: Right Arm, Patient Position: Sitting, Cuff Size: Normal)   Ht 5\' 5"  (1.651 m)   Wt 154 lb (69.9 kg)   LMP 06/10/2016 (Approximate)   BMI 25.63 kg/m   Body mass index is 25.63 kg/m.  General appearance : Well developed well nourished female. No acute distress HEENT: Eyes: no retinal hemorrhage or exudates,  Neck supple, trachea midline, no carotid bruits, no thyroidmegaly Lungs: Clear to auscultation, no rhonchi or wheezes, or rib retractions  Heart: Regular rate and rhythm, no murmurs or gallops Breast:Examined in sitting and supine position were symmetrical in appearance, no palpable masses or tenderness,  no skin retraction, no nipple inversion, no nipple discharge, no skin discoloration, no axillary or supraclavicular lymphadenopathy Abdomen: no palpable masses or tenderness, no rebound or guarding Extremities: no edema or skin discoloration or tenderness  Pelvic: Vulva: Normal             Vagina: No gross lesions or discharge  Cervix: No gross lesions or discharge  Uterus AV, normal size, shape and consistency, non-tender and mobile  Adnexa  Without  masses or tenderness  Anus: Normal  Pelvic US 09/03/2019: T/V images.  Anteverted uterus normal in size and shape with no myometrial mass.  The uterus is measured at 6.65 x 3.95 x 3.47 cm.  The endometrial lining is thin and symmetrical with no mass or thickening seen.  The endometrial lining is measured at 3.27 mm.  Right ovary seen with a small cyst with internal echoes suggestive of a corpus luteum cyst measured at 1.2 x 1.4 cm.  Difficult visualization of the left ovary which is seen transabdominally only.   No adnexal mass bilaterally.  No free fluid in the posterior cul-de-sac.    Assessment/Plan:  54 y.o. female for annual exam   1. Well female exam with routine gynecological exam Normal gynecologic exam in menopause.  No indication to repeat a Pap test this year, will repeat at 3 years next year.  Breast exam normal.  Screening mammogram December 2020 was negative.  Good body mass index at 25.63.  Will reincrease fitness and continue with a healthy nutrition.  Health labs with family physician.  Hypercholesterolemia on fluvastatin XL.  2. Postmenopausal hormone replacement therapy Well on hormone replacement therapy.  No postmenopausal bleeding since the recent ultrasound September 03, 2019 which showed a thin normal endometrial lining at 3.27 mm.  No contraindication to continue on hormone replacement therapy.  Estradiol 0.0375 twice a week and progesterone 100 mg 1 tablet per mouth at bedtime represcribed.  Other orders - estradiol (VIVELLE-DOT) 0.0375 MG/24HR; Place 1 patch onto the skin 2 (two) times a week. - progesterone (PROMETRIUM) 100 MG capsule; Take 1 capsule (100 mg total) by mouth at bedtime.  Princess Bruins MD, 8:49 AM 10/26/2019

## 2019-11-03 ENCOUNTER — Other Ambulatory Visit: Payer: Self-pay | Admitting: Obstetrics & Gynecology

## 2019-11-21 ENCOUNTER — Other Ambulatory Visit: Payer: Self-pay | Admitting: Family Medicine

## 2019-11-21 DIAGNOSIS — E782 Mixed hyperlipidemia: Secondary | ICD-10-CM

## 2019-12-03 ENCOUNTER — Encounter: Payer: BC Managed Care – PPO | Admitting: Family Medicine

## 2019-12-09 ENCOUNTER — Other Ambulatory Visit: Payer: Self-pay | Admitting: Obstetrics & Gynecology

## 2019-12-09 DIAGNOSIS — Z1231 Encounter for screening mammogram for malignant neoplasm of breast: Secondary | ICD-10-CM

## 2019-12-10 ENCOUNTER — Other Ambulatory Visit: Payer: Self-pay

## 2019-12-10 ENCOUNTER — Ambulatory Visit (INDEPENDENT_AMBULATORY_CARE_PROVIDER_SITE_OTHER): Payer: BC Managed Care – PPO | Admitting: Family Medicine

## 2019-12-10 ENCOUNTER — Encounter: Payer: Self-pay | Admitting: Family Medicine

## 2019-12-10 VITALS — BP 118/78 | HR 75 | Temp 98.5°F | Ht 65.0 in | Wt 154.6 lb

## 2019-12-10 DIAGNOSIS — E782 Mixed hyperlipidemia: Secondary | ICD-10-CM | POA: Diagnosis not present

## 2019-12-10 DIAGNOSIS — Z0000 Encounter for general adult medical examination without abnormal findings: Secondary | ICD-10-CM

## 2019-12-10 DIAGNOSIS — R7303 Prediabetes: Secondary | ICD-10-CM

## 2019-12-10 MED ORDER — FLUVASTATIN SODIUM ER 80 MG PO TB24: ORAL_TABLET | ORAL | 4 refills | Status: DC

## 2019-12-10 NOTE — Patient Instructions (Signed)
Preventive Care 40-54 Years Old, Female Preventive care refers to visits with your health care provider and lifestyle choices that can promote health and wellness. This includes:  A yearly physical exam. This may also be called an annual well check.  Regular dental visits and eye exams.  Immunizations.  Screening for certain conditions.  Healthy lifestyle choices, such as eating a healthy diet, getting regular exercise, not using drugs or products that contain nicotine and tobacco, and limiting alcohol use. What can I expect for my preventive care visit? Physical exam Your health care provider will check your:  Height and weight. This may be used to calculate body mass index (BMI), which tells if you are at a healthy weight.  Heart rate and blood pressure.  Skin for abnormal spots. Counseling Your health care provider may ask you questions about your:  Alcohol, tobacco, and drug use.  Emotional well-being.  Home and relationship well-being.  Sexual activity.  Eating habits.  Work and work environment.  Method of birth control.  Menstrual cycle.  Pregnancy history. What immunizations do I need?  Influenza (flu) vaccine  This is recommended every year. Tetanus, diphtheria, and pertussis (Tdap) vaccine  You may need a Td booster every 10 years. Varicella (chickenpox) vaccine  You may need this if you have not been vaccinated. Zoster (shingles) vaccine  You may need this after age 60. Measles, mumps, and rubella (MMR) vaccine  You may need at least one dose of MMR if you were born in 1957 or later. You may also need a second dose. Pneumococcal conjugate (PCV13) vaccine  You may need this if you have certain conditions and were not previously vaccinated. Pneumococcal polysaccharide (PPSV23) vaccine  You may need one or two doses if you smoke cigarettes or if you have certain conditions. Meningococcal conjugate (MenACWY) vaccine  You may need this if you  have certain conditions. Hepatitis A vaccine  You may need this if you have certain conditions or if you travel or work in places where you may be exposed to hepatitis A. Hepatitis B vaccine  You may need this if you have certain conditions or if you travel or work in places where you may be exposed to hepatitis B. Haemophilus influenzae type b (Hib) vaccine  You may need this if you have certain conditions. Human papillomavirus (HPV) vaccine  If recommended by your health care provider, you may need three doses over 6 months. You may receive vaccines as individual doses or as more than one vaccine together in one shot (combination vaccines). Talk with your health care provider about the risks and benefits of combination vaccines. What tests do I need? Blood tests  Lipid and cholesterol levels. These may be checked every 5 years, or more frequently if you are over 50 years old.  Hepatitis C test.  Hepatitis B test. Screening  Lung cancer screening. You may have this screening every year starting at age 55 if you have a 30-pack-year history of smoking and currently smoke or have quit within the past 15 years.  Colorectal cancer screening. All adults should have this screening starting at age 50 and continuing until age 75. Your health care provider may recommend screening at age 45 if you are at increased risk. You will have tests every 1-10 years, depending on your results and the type of screening test.  Diabetes screening. This is done by checking your blood sugar (glucose) after you have not eaten for a while (fasting). You may have this   done every 1-3 years.  Mammogram. This may be done every 1-2 years. Talk with your health care provider about when you should start having regular mammograms. This may depend on whether you have a family history of breast cancer.  BRCA-related cancer screening. This may be done if you have a family history of breast, ovarian, tubal, or peritoneal  cancers.  Pelvic exam and Pap test. This may be done every 3 years starting at age 48. Starting at age 24, this may be done every 5 years if you have a Pap test in combination with an HPV test. Other tests  Sexually transmitted disease (STD) testing.  Bone density scan. This is done to screen for osteoporosis. You may have this scan if you are at high risk for osteoporosis. Follow these instructions at home: Eating and drinking  Eat a diet that includes fresh fruits and vegetables, whole grains, lean protein, and low-fat dairy.  Take vitamin and mineral supplements as recommended by your health care provider.  Do not drink alcohol if: ? Your health care provider tells you not to drink. ? You are pregnant, may be pregnant, or are planning to become pregnant.  If you drink alcohol: ? Limit how much you have to 0-1 drink a day. ? Be aware of how much alcohol is in your drink. In the [place] one drink equals one 12 oz bottle of beer (355 mL), one 5 oz glass of wine (148 mL), or one 1 oz glass of hard liquor (44 mL). Lifestyle  Take daily care of your teeth and gums.  Stay active. Exercise for at least 30 minutes on 5 or more days each week.  Do not use any products that contain nicotine or tobacco, such as cigarettes, e-cigarettes, and chewing tobacco. If you need help quitting, ask your health care provider.  If you are sexually active, practice safe sex. Use a condom or other form of birth control (contraception) in order to prevent pregnancy and STIs (sexually transmitted infections).  If told by your health care provider, take low-dose aspirin daily starting at age 39. What's next?  Visit your health care provider once a year for a well check visit.  Ask your health care provider how often you should have your eyes and teeth checked.  Stay up to date on all vaccines. This information is not intended to replace advice given to you by your health care provider. Make sure you  discuss any questions you have with your health care provider. Document Revised: 09/26/2017 Document Reviewed: 09/26/2017 Elsevier Patient Education  [place].  High Cholesterol  High cholesterol is a condition in which the blood has high levels of a white, waxy, fat-like substance (cholesterol). The human body needs small amounts of cholesterol. The liver makes all the cholesterol that the body needs. Extra (excess) cholesterol comes from the food that we eat. Cholesterol is carried from the liver by the blood through the blood vessels. If you have high cholesterol, deposits (plaques) may build up on the walls of your blood vessels (arteries). Plaques make the arteries narrower and stiffer. Cholesterol plaques increase your risk for heart attack and stroke. Work with your health care provider to keep your cholesterol levels in a healthy range. What increases the risk? This condition is more likely to develop in people who:  Eat foods that are high in animal fat (saturated fat) or cholesterol.  Are overweight.  Are not getting enough exercise.  Have a family history of high cholesterol.  What are the signs or symptoms? There are no symptoms of this condition. How is this diagnosed? This condition may be diagnosed from the results of a blood test.  If you are older than age 29, your health care provider may check your cholesterol every 4-6 years.  You may be checked more often if you already have high cholesterol or other risk factors for heart disease. The blood test for cholesterol measures:  "Bad" cholesterol (LDL cholesterol). This is the main type of cholesterol that causes heart disease. The desired level for LDL is less than 100.  "Good" cholesterol (HDL cholesterol). This type helps to protect against heart disease by cleaning the arteries and carrying the LDL away. The desired level for HDL is 60 or higher.  Triglycerides. These are fats that the body can store or  burn for energy. The desired number for triglycerides is lower than 150.  Total cholesterol. This is a measure of the total amount of cholesterol in your blood, including LDL cholesterol, HDL cholesterol, and triglycerides. A healthy number is less than 200. How is this treated? This condition is treated with diet changes, lifestyle changes, and medicines. Diet changes  This may include eating more whole grains, fruits, vegetables, nuts, and fish.  This may also include cutting back on red meat and foods that have a lot of added sugar. Lifestyle changes  Changes may include getting at least 40 minutes of aerobic exercise 3 times a week. Aerobic exercises include walking, biking, and swimming. Aerobic exercise along with a healthy diet can help you maintain a healthy weight.  Changes may also include quitting smoking. Medicines  Medicines are usually given if diet and lifestyle changes have failed to reduce your cholesterol to healthy levels.  Your health care provider may prescribe a statin medicine. Statin medicines have been shown to reduce cholesterol, which can reduce the risk of heart disease. Follow these instructions at home: Eating and drinking If told by your health care provider:  Eat chicken (without skin), fish, veal, shellfish, ground [place] breast, and round or loin cuts of red meat.  Do not eat fried foods or fatty meats, such as hot dogs and salami.  Eat plenty of fruits, such as apples.  Eat plenty of vegetables, such as broccoli, potatoes, and carrots.  Eat beans, peas, and lentils.  Eat grains such as barley, rice, couscous, and bulgur wheat.  Eat pasta without cream sauces.  Use skim or nonfat milk, and eat low-fat or nonfat yogurt and cheeses.  Do not eat or drink whole milk, cream, ice cream, egg yolks, or hard cheeses.  Do not eat stick margarine or tub margarines that contain trans fats (also called partially hydrogenated oils).  Do not eat  saturated tropical oils, such as coconut oil and palm oil.  Do not eat cakes, cookies, crackers, or other baked goods that contain trans fats.  General instructions  Exercise as directed by your health care provider. Increase your activity level with activities such as gardening, walking, and taking the stairs.  Take over-the-counter and prescription medicines only as told by your health care provider.  Do not use any products that contain nicotine or tobacco, such as cigarettes and e-cigarettes. If you need help quitting, ask your health care provider.  Keep all follow-up visits as told by your health care provider. This is important. Contact a health care provider if:  You are struggling to maintain a healthy diet or weight.  You need help to start on an  exercise program.  You need help to stop smoking. Get help right away if:  You have chest pain.  You have trouble breathing. This information is not intended to replace advice given to you by your health care provider. Make sure you discuss any questions you have with your health care provider. Document Revised: 01/18/2017 Document Reviewed: 07/16/2015 Elsevier Patient Education  2020 [place].  Prediabetes Eating Plan Prediabetes is a condition that causes blood sugar (glucose) levels to be higher than normal. This increases the risk for developing diabetes. In order to prevent diabetes from developing, your health care provider may recommend a diet and other lifestyle changes to help you:  Control your blood glucose levels.  Improve your cholesterol levels.  Manage your blood pressure. Your health care provider may recommend working with a diet and nutrition specialist (dietitian) to make a meal plan that is best for you. What are tips for following this plan? Lifestyle  Set weight loss goals with the help of your health care team. It is recommended that most people with prediabetes lose 7% of their current body  weight.  Exercise for at least 30 minutes at least 5 days a week.  Attend a support group or seek ongoing support from a mental health counselor.  Take over-the-counter and prescription medicines only as told by your health care provider. Reading food labels  Read food labels to check the amount of fat, salt (sodium), and sugar in prepackaged foods. Avoid foods that have: ? Saturated fats. ? Trans fats. ? Added sugars.  Avoid foods that have more than 300 milligrams (mg) of sodium per serving. Limit your daily sodium intake to less than 2,300 mg each day. Shopping  Avoid buying pre-made and processed foods. Cooking  Cook with olive oil. Do not use butter, lard, or ghee.  Bake, broil, grill, or boil foods. Avoid frying. Meal planning   Work with your dietitian to develop an eating plan that is right for you. This may include: ? Tracking how many calories you take in. Use a food diary, notebook, or mobile application to track what you eat at each meal. ? Using the glycemic index (GI) to plan your meals. The index tells you how quickly a food will raise your blood glucose. Choose low-GI foods. These foods take a longer time to raise blood glucose.  Consider following a Mediterranean diet. This diet includes: ? Several servings each day of fresh fruits and vegetables. ? Eating fish at least twice a week. ? Several servings each day of whole grains, beans, nuts, and seeds. ? Using olive oil instead of other fats. ? Moderate alcohol consumption. ? Eating small amounts of red meat and whole-fat dairy.  If you have high blood pressure, you may need to limit your sodium intake or follow a diet such as the DASH eating plan. DASH is an eating plan that aims to lower high blood pressure. What foods are recommended? The items listed below may not be a complete list. Talk with your dietitian about what dietary choices are best for you. Grains Whole grains, such as whole-wheat or  whole-grain breads, crackers, cereals, and pasta. Unsweetened oatmeal. Bulgur. Barley. Quinoa. Brown rice. Corn or whole-wheat flour tortillas or taco shells. Vegetables Lettuce. Spinach. Peas. Beets. Cauliflower. Cabbage. Broccoli. Carrots. Tomatoes. Squash. Eggplant. Herbs. Peppers. Onions. Cucumbers. Brussels sprouts. Fruits Berries. Bananas. Apples. Oranges. Grapes. Papaya. Mango. Pomegranate. Kiwi. Grapefruit. Cherries. Meats and other protein foods Seafood. Poultry without skin. Lean cuts of pork and beef.  Tofu. Eggs. Nuts. Beans. Dairy Low-fat or fat-free dairy products, such as yogurt, cottage cheese, and cheese. Beverages Water. Tea. Coffee. Sugar-free or diet soda. Seltzer water. Lowfat or no-fat milk. Milk alternatives, such as soy or almond milk. Fats and oils Olive oil. Canola oil. Sunflower oil. Grapeseed oil. Avocado. Walnuts. Sweets and desserts Sugar-free or low-fat pudding. Sugar-free or low-fat ice cream and other frozen treats. Seasoning and other foods Herbs. Sodium-free spices. Mustard. Relish. Low-fat, low-sugar ketchup. Low-fat, low-sugar barbecue sauce. Low-fat or fat-free mayonnaise. What foods are not recommended? The items listed below may not be a complete list. Talk with your dietitian about what dietary choices are best for you. Grains Refined white flour and flour products, such as bread, pasta, snack foods, and cereals. Vegetables Canned vegetables. Frozen vegetables with butter or cream sauce. Fruits Fruits canned with syrup. Meats and other protein foods Fatty cuts of meat. Poultry with skin. Breaded or fried meat. Processed meats. Dairy Full-fat yogurt, cheese, or milk. Beverages Sweetened drinks, such as sweet iced tea and soda. Fats and oils Butter. Lard. Ghee. Sweets and desserts Baked goods, such as cake, cupcakes, pastries, cookies, and cheesecake. Seasoning and other foods Spice mixes with added salt. Ketchup. Barbecue sauce.  Mayonnaise. Summary  To prevent diabetes from developing, you may need to make diet and other lifestyle changes to help control blood sugar, improve cholesterol levels, and manage your blood pressure.  Set weight loss goals with the help of your health care team. It is recommended that most people with prediabetes lose 7 percent of their current body weight.  Consider following a Mediterranean diet that includes plenty of fresh fruits and vegetables, whole grains, beans, nuts, seeds, fish, lean meat, low-fat dairy, and healthy oils. This information is not intended to replace advice given to you by your health care provider. Make sure you discuss any questions you have with your health care provider. Document Revised: 05/09/2018 Document Reviewed: 03/21/2016 Elsevier Patient Education  2020 [employer].

## 2019-12-10 NOTE — Progress Notes (Signed)
Subjective:     Amanda Zuniga is a 54 y.o. female and is here for a comprehensive physical exam. The patient reports no problems.  Patient states she is doing well overall.  Trying to stay active walks her new puppy for exercise.  Patient by eating pasta and cheese, concerned about hemoglobin A1c and cholesterol.  Taking fluvastatin XL 80 mg 3 times per week.  Endorses family history of elevated cholesterol.  Patient followed by OB/GYN, had appointment in September.  Patient had a child in high school and her 28 year old daughter will be getting married in May 2022.  Busy planning for the wedding.6  Social History   Socioeconomic History  . Marital status: Married    Spouse name: Not on file  . Number of children: 2  . Years of education: Not on file  . Highest education level: Not on file  Occupational History  . Occupation: Best boy: [employer]  Tobacco Use  . Smoking status: Never Smoker  . Smokeless tobacco: Never Used  Vaping Use  . Vaping Use: Never used  Substance and Sexual Activity  . Alcohol use: Yes    Alcohol/week: 0.0 standard drinks    Comment: socially  . Drug use: No  . Sexual activity: Yes    Partners: Male    Comment: PARTNER WITH VASECTOMY  Other Topics Concern  . Not on file  Social History Narrative  . Not on file   Social Determinants of Health   Financial Resource Strain:   . Difficulty of Paying Living Expenses: Not on file  Food Insecurity:   . Worried About Charity fundraiser in the Last Year: Not on file  . Ran Out of Food in the Last Year: Not on file  Transportation Needs:   . Lack of Transportation (Medical): Not on file  . Lack of Transportation (Non-Medical): Not on file  Physical Activity:   . Days of Exercise per Week: Not on file  . Minutes of Exercise per Session: Not on file  Stress:   . Feeling of Stress : Not on file  Social Connections:   . Frequency of Communication with Friends and Family: Not on file  .  Frequency of Social Gatherings with Friends and Family: Not on file  . Attends Religious Services: Not on file  . Active Member of Clubs or Organizations: Not on file  . Attends Archivist Meetings: Not on file  . Marital Status: Not on file  Intimate Partner Violence:   . Fear of Current or Ex-Partner: Not on file  . Emotionally Abused: Not on file  . Physically Abused: Not on file  . Sexually Abused: Not on file   Health Maintenance  Topic Date Due  . Hepatitis C Screening  Never done  . PNEUMOCOCCAL POLYSACCHARIDE VACCINE AGE 65-64 HIGH RISK  Never done  . FOOT EXAM  Never done  . OPHTHALMOLOGY EXAM  Never done  . URINE MICROALBUMIN  Never done  . HIV Screening  Never done  . HEMOGLOBIN A1C  01/10/2019  . INFLUENZA VACCINE  08/30/2019  . PAP SMEAR-Modifier  08/20/2020  . MAMMOGRAM  01/25/2021  . TETANUS/TDAP  04/22/2022  . COLONOSCOPY  12/12/2023    The following portions of the patient's history were reviewed and updated as appropriate: allergies, current medications, past family history, past medical history, past social history, past surgical history and problem list.  Review of Systems A comprehensive review of systems was negative.  Objective:    BP 118/78 (BP Location: Left Arm, Patient Position: Sitting, Cuff Size: Normal)   Pulse 75   Temp 98.5 F (36.9 C) (Oral)   Ht _0  (1.651 m)   Wt 154 lb 9.6 oz (70.1 kg)   LMP 06/10/2016 (Approximate)   SpO2 99%   BMI 25.73 kg/m  General appearance: alert, cooperative and no distress Head: Normocephalic, without obvious abnormality, atraumatic Eyes: conjunctivae/corneas clear. PERRL, EOM's intact. Fundi benign. Ears: normal TM's and external ear canals both ears Nose: Nares normal. Septum midline. Mucosa normal. No drainage or sinus tenderness. Throat: lips, mucosa, and tongue normal; teeth and gums normal Neck: no adenopathy, no carotid bruit, no JVD, supple, symmetrical, trachea midline and thyroid not  enlarged, symmetric, no tenderness/mass/nodules Lungs: clear to auscultation bilaterally Heart: regular rate and rhythm, S1, S2 normal, no murmur, click, rub or gallop Abdomen: soft, non-tender; bowel sounds normal; no masses,  no organomegaly Extremities: extremities normal, atraumatic, no cyanosis or edema Pulses: 2+ and symmetric Skin: Skin color, texture, turgor normal. No rashes or lesions Lymph nodes: Cervical, supraclavicular, and axillary nodes normal. Neurologic: Alert and oriented X 3, normal strength and tone. Normal symmetric reflexes. Normal coordination and gait    Assessment:    Healthy female exam.      Plan:     Anticipatory guidance given including wearing seatbelts, smoke detectors in the home, increasing physical activity, increasing p.o. intake of water and vegetables. -will obtain labs -Mammogram scheduled for December 2021 -Pap up-to-date followed by OB/GYN -Colonoscopy due in 2015 repeat in 2025 -Given handout -Next CPE in 1 year See After Visit Summary for Counseling Recommendations    Mixed hyperlipidemia  -Continue fluvastatin XL 80 mg 3 times per week -Continue lifestyle modifications - Plan: CMP with eGFR([facility]), Lipid panel, fluvastatin XL 80 mg  Prediabetes -Last hemoglobin A1c 5.8% on 07/11/2018 -Discussed lifestyle modifications - Plan: Hemoglobin A1c  F/u prn   Grier Mitts, MD

## 2019-12-11 LAB — LIPID PANEL
Cholesterol: 187 mg/dL (ref <200)
HDL: 58 mg/dL (ref >=50)
LDL Cholesterol (Calc): 108 mg/dL (calc) — ABNORMAL HIGH
Non-HDL Cholesterol (Calc): 129 mg/dL (calc) (ref <130)
Total CHOL/HDL Ratio: 3.2 (calc) (ref <5.0)
Triglycerides: 110 mg/dL (ref <150)

## 2019-12-11 LAB — COMPLETE METABOLIC PANEL WITH GFR
AG Ratio: 1.8 (calc) (ref 1.0–2.5)
ALT: 16 U/L (ref 6–29)
AST: 15 U/L (ref 10–35)
Albumin: 4.6 g/dL (ref 3.6–5.1)
Alkaline phosphatase (APISO): 80 U/L (ref 37–153)
BUN: 20 mg/dL (ref 7–25)
CO2: 27 mmol/L (ref 20–32)
Calcium: 9.4 mg/dL (ref 8.6–10.4)
Chloride: 104 mmol/L (ref 98–110)
Creat: 0.85 mg/dL (ref 0.50–1.05)
GFR, Est African American: 90 mL/min/{1.73_m2} (ref >=60)
GFR, Est Non African American: 78 mL/min/{1.73_m2} (ref >=60)
Globulin: 2.5 g/dL (calc) (ref 1.9–3.7)
Glucose, Bld: 94 mg/dL (ref 65–99)
Potassium: 4.8 mmol/L (ref 3.5–5.3)
Sodium: 140 mmol/L (ref 135–146)
Total Bilirubin: 0.5 mg/dL (ref 0.2–1.2)
Total Protein: 7.1 g/dL (ref 6.1–8.1)

## 2019-12-11 LAB — CBC
HCT: 42.8 % (ref 35.0–45.0)
Hemoglobin: 14.2 g/dL (ref 11.7–15.5)
MCH: 30.1 pg (ref 27.0–33.0)
MCHC: 33.2 g/dL (ref 32.0–36.0)
MCV: 90.7 fL (ref 80.0–100.0)
MPV: 10.2 fL (ref 7.5–12.5)
Platelets: 371 10*3/uL (ref 140–400)
RBC: 4.72 10*6/uL (ref 3.80–5.10)
RDW: 11.9 % (ref 11.0–15.0)
WBC: 6.8 10*3/uL (ref 3.8–10.8)

## 2019-12-11 LAB — HEMOGLOBIN A1C
Hgb A1c MFr Bld: 5.7 % of total Hgb — ABNORMAL HIGH (ref <5.7)
Mean Plasma Glucose: 117 (calc)
eAG (mmol/L): 6.5 (calc)

## 2019-12-16 ENCOUNTER — Telehealth: Payer: Self-pay | Admitting: Family Medicine

## 2019-12-16 NOTE — Telephone Encounter (Signed)
Patient is returning the call for lab results, please advise. CB is [phone]

## 2019-12-16 NOTE — Telephone Encounter (Signed)
Reviewed pt lab results and recommendations verbalized understanding

## 2019-12-31 ENCOUNTER — Other Ambulatory Visit: Payer: Self-pay | Admitting: Obstetrics & Gynecology

## 2020-01-27 ENCOUNTER — Other Ambulatory Visit: Payer: Self-pay

## 2020-01-27 ENCOUNTER — Ambulatory Visit
Admission: RE | Admit: 2020-01-27 | Discharge: 2020-01-27 | Disposition: A | Discharge status: Home / Self Care | Payer: BC Managed Care – PPO | Source: Ambulatory Visit | Attending: Obstetrics & Gynecology | Admitting: Obstetrics & Gynecology

## 2020-01-27 DIAGNOSIS — Z1231 Encounter for screening mammogram for malignant neoplasm of breast: Secondary | ICD-10-CM

## 2020-02-23 ENCOUNTER — Telehealth: Payer: Self-pay | Admitting: *Deleted

## 2020-02-23 NOTE — Telephone Encounter (Signed)
Patient called and left detailed message on voicemail c/o UTI asking if Rx can be called in. I called patient back and left detailed message on her cell she will need to schedule office visit with provider.

## 2020-02-24 ENCOUNTER — Encounter: Payer: Self-pay | Admitting: Obstetrics & Gynecology

## 2020-02-24 ENCOUNTER — Other Ambulatory Visit: Payer: Self-pay

## 2020-02-24 ENCOUNTER — Ambulatory Visit (INDEPENDENT_AMBULATORY_CARE_PROVIDER_SITE_OTHER): Payer: BC Managed Care – PPO | Admitting: Obstetrics & Gynecology

## 2020-02-24 VITALS — BP 120/80 | HR 70 | Resp 14 | Ht 65.5 in | Wt 152.0 lb

## 2020-02-24 DIAGNOSIS — R300 Dysuria: Secondary | ICD-10-CM | POA: Diagnosis not present

## 2020-02-24 MED ORDER — SULFAMETHOXAZOLE-TRIMETHOPRIM 800-160 MG PO TABS: 1.0000 | ORAL_TABLET | Freq: Two times a day (BID) | ORAL | 0 refills | Status: AC

## 2020-02-24 MED ORDER — FLUCONAZOLE 150 MG PO TABS: 150.0000 mg | ORAL_TABLET | Freq: Every day | ORAL | 0 refills | Status: AC

## 2020-02-24 NOTE — Progress Notes (Signed)
    Amanda Zuniga 1965-10-30 [id number]        55 y.o.  [id number]   RP: Burning with urination x 3 days  HPI: Burning with urination x 3 days.  Suprapubic discomfort.  No blood seen in urine.  No vaginal d/c.  No fever.   OB History  Gravida Para Term Preterm AB Living  4 2 1 1 2 2   SAB IAB Ectopic Multiple Live Births  1   1   2     # Outcome Date GA Lbr Len/2nd Weight Sex Delivery Anes PTL Lv  4 Ectopic           3 SAB           2 Preterm     M CS-Unspec  Y LIV  1 Term     M Vag-Spont  N LIV    Past medical history,surgical history, problem list, medications, allergies, family history and social history were all reviewed and documented in the EPIC chart.   Directed ROS with pertinent positives and negatives documented in the history of present illness/assessment and plan.  Exam:  Vitals:   02/24/20 1029  BP: 120/80  Pulse: 70  Resp: 14  Weight: 152 lb (68.9 kg)  Height: 5' 5.5" (1.664 m)   General appearance:  Normal  CVAT Negative bilaterally  Abdomen:  Soft, NT  Gynecologic exam: Deferred  U/A: Yellow clear, protein negative, nitrites positive, white blood cells 10-20, red blood cells 0-2, bacteria moderate.  Urine culture pending.   Assessment/Plan:  55 y.o. [id number]   1. Burning with urination Probable acute cystitis per symptoms and urine analysis. Decision to treat with Bactrim DS. Usage reviewed and prescription sent to pharmacy. Pending urine culture. Fluconazole prescribed to prevent lower treat yeast vaginitis post antibiotics. - Urinalysis,Complete w/RFL Culture  Other orders - sulfamethoxazole-trimethoprim (BACTRIM DS) 800-160 MG tablet; Take 1 tablet by mouth 2 (two) times daily for 3 days. - fluconazole (DIFLUCAN) 150 MG tablet; Take 1 tablet (150 mg total) by mouth daily for 3 days.  Princess Bruins MD, 10:50 AM 02/24/2020

## 2020-02-27 ENCOUNTER — Encounter: Payer: Self-pay | Admitting: Obstetrics & Gynecology

## 2020-02-27 LAB — URINALYSIS, COMPLETE W/RFL CULTURE
Bilirubin Urine: NEGATIVE
Glucose, UA: NEGATIVE
Hyaline Cast: NONE SEEN /LPF
Ketones, ur: NEGATIVE
Nitrites, Initial: POSITIVE — AB
Protein, ur: NEGATIVE
Specific Gravity, Urine: 1.004 (ref 1.001–1.03)
pH: 7 (ref 5.0–8.0)

## 2020-02-27 LAB — URINE CULTURE
MICRO NUMBER:: 11459564
SPECIMEN QUALITY:: ADEQUATE

## 2020-02-27 LAB — CULTURE INDICATED

## 2020-04-21 ENCOUNTER — Other Ambulatory Visit: Payer: Self-pay | Admitting: Obstetrics & Gynecology

## 2020-08-03 ENCOUNTER — Encounter (HOSPITAL_BASED_OUTPATIENT_CLINIC_OR_DEPARTMENT_OTHER): Payer: Self-pay

## 2020-08-03 ENCOUNTER — Other Ambulatory Visit: Payer: Self-pay

## 2020-08-03 ENCOUNTER — Emergency Department (HOSPITAL_BASED_OUTPATIENT_CLINIC_OR_DEPARTMENT_OTHER)
Admission: EM | Admit: 2020-08-03 | Discharge: 2020-08-03 | Disposition: A | Discharge status: Home / Self Care | Payer: BC Managed Care – PPO | Attending: Emergency Medicine | Admitting: Emergency Medicine

## 2020-08-03 DIAGNOSIS — E1169 Type 2 diabetes mellitus with other specified complication: Secondary | ICD-10-CM | POA: Diagnosis not present

## 2020-08-03 DIAGNOSIS — Z2914 Encounter for prophylactic rabies immune globin: Secondary | ICD-10-CM | POA: Diagnosis not present

## 2020-08-03 DIAGNOSIS — E785 Hyperlipidemia, unspecified: Secondary | ICD-10-CM | POA: Insufficient documentation

## 2020-08-03 DIAGNOSIS — Z23 Encounter for immunization: Secondary | ICD-10-CM | POA: Insufficient documentation

## 2020-08-03 DIAGNOSIS — Z203 Contact with and (suspected) exposure to rabies: Secondary | ICD-10-CM | POA: Diagnosis not present

## 2020-08-03 DIAGNOSIS — Z209 Contact with and (suspected) exposure to unspecified communicable disease: Secondary | ICD-10-CM

## 2020-08-03 MED ORDER — RABIES VACCINE, PCEC IM SUSR
1.0000 mL | Freq: Once | INTRAMUSCULAR | Status: AC
  Administered 2020-08-03: 1 mL via INTRAMUSCULAR
  Filled 2020-08-03: qty 1

## 2020-08-03 MED ORDER — RABIES IMMUNE GLOBULIN 150 UNIT/ML IM INJ
20.0000 [IU]/kg | INJECTION | Freq: Once | INTRAMUSCULAR | Status: AC
  Administered 2020-08-03: 1425 [IU] via INTRAMUSCULAR
  Filled 2020-08-03: qty 10

## 2020-08-03 NOTE — ED Provider Notes (Signed)
[facility] EMERGENCY DEPT Provider Note   CSN: [id number] Arrival date & time: 08/03/20  2979     History Chief Complaint  Patient presents with   Animal Bite    Amanda Zuniga is a 55 y.o. female exposure to bat in the house approximately 2 weeks ago.  Found a small bat in her bedroom with her husband on father's day weekend.  They spoke to family friends who advised coming for the rabies series.  No reported bites at home; no other symptoms.  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   Perimenopause 09/09/2012   CIN I (cervical intraepithelial neoplasia I)    Ectopic pregnancy    Diabetes mellitus    H/O: vasectomy     Past Surgical History:  Procedure Laterality Date   ABLATION ON ENDOMETRIOSIS N/A 07/01/2013   Procedure: Fulguration of ENDOMETRIOSIS;  Surgeon: Terrance Mass, MD;  Location: [facility];  Service: Gynecology;  Laterality: N/A;   CERVICAL BIOPSY  W/ LOOP ELECTRODE EXCISION     CESAREAN SECTION  2004   33 weeks-hyprops, fetal tachycardia   COLONOSCOPY     2016   DILATION AND CURETTAGE OF UTERUS  1997   miscarriage   LAPAROSCOPY Bilateral 07/01/2013   Procedure: LAPAROSCOPY OPERATIVE/BILATERAL OVARIAN CYSTECTOMY;  Surgeon: Terrance Mass, MD;  Location: [facility];  Service: Gynecology;  Laterality: Bilateral;  bilateral ovarian cystectomy      OB History     Gravida  4   Para  2   Term  1    Preterm  1   AB  2   Living  2      SAB  1   IAB      Ectopic  1   Multiple      Live Births  2           Family History  Problem Relation Age of Onset   Bladder Cancer Mother    Heart disease Mother        MI   Bladder Cancer Maternal Grandmother    Colon cancer Neg Hx    Esophageal cancer Neg Hx    Rectal cancer Neg Hx    Stomach cancer Neg Hx     Social History   Tobacco Use   Smoking status: Never   Smokeless tobacco: Never  Vaping Use   Vaping Use: Never used  Substance Use Topics   Alcohol use: Yes    Alcohol/week: 0.0 standard drinks    Comment: socially   Drug use: No    Home Medications Prior to Admission medications   Medication Sig Start Date End Date Taking? Authorizing Provider  ALPRAZolam (XANAX) 0.25 MG tablet TAKE 1 TABLET BY MOUTH AT BEDTIME AS NEEDED FOR SLEEP 04/22/20   Princess Bruins, MD  estradiol (VIVELLE-DOT) 0.0375 MG/24HR Place 1 patch onto the skin 2 (two) times  a week. 10/26/19   Princess Bruins, MD  fluvastatin XL (LESCOL XL) 80 MG 24 hr tablet TAKE 1 TABLET BY MOUTH THREE TIMES A WEEK 12/10/19   Billie Ruddy, MD  progesterone (PROMETRIUM) 100 MG capsule Take 1 capsule (100 mg total) by mouth at bedtime. 10/26/19   Princess Bruins, MD    Allergies    Patient has no known allergies.  Review of Systems   Review of Systems  Constitutional:  Negative for chills and fever.  Musculoskeletal:  Negative for arthralgias and myalgias.  Skin:  Negative for rash and wound.  Neurological:  Negative for light-headedness and headaches.   Physical Exam Updated Vital Signs BP 127/65 (BP Location: Left Arm)   Pulse 77   Temp 98.3 F (36.8 C)   Resp 18   Wt 71 kg   LMP 06/10/2016 (Approximate)   SpO2 100%   BMI 25.64 kg/m   Physical Exam Constitutional:      General: She is not in acute distress. HENT:     Head: Normocephalic and atraumatic.  Eyes:     Conjunctiva/sclera: Conjunctivae normal.     Pupils:  Pupils are equal, round, and reactive to light.  Cardiovascular:     Rate and Rhythm: Normal rate and regular rhythm.  Skin:    General: Skin is warm and dry.  Neurological:     Mental Status: She is alert.    ED Results / Procedures / Treatments   Labs (all labs ordered are listed, but only abnormal results are displayed) Labs Reviewed - No data to display  EKG None  Radiology No results found.  Procedures Procedures   Medications Ordered in ED Medications  rabies immune globulin (HYPERAB/KEDRAB) injection 1,425 Units (1,425 Units Intramuscular Given 08/03/20 1127)  rabies vaccine (RABAVERT) injection 1 mL (1 mL Intramuscular Given 08/03/20 1124)    ED Course  I have reviewed the triage vital signs and the nursing notes.  Pertinent labs & imaging results that were available during my care of the patient were reviewed by me and considered in my medical decision making (see chart for details).  Exposure to bat in house 2 weeks ago, no known bites Requesting rabies series We'll initiate the series here, she will follow up at [facility] with the rest of the family at the indicated dates for the rest of the series     Final Clinical Impression(s) / ED Diagnoses Final diagnoses:  Exposure to bat without known bite    Rx / DC Orders ED Discharge Orders     None        Rodney Yera, Carola Rhine, MD 08/03/20 1752

## 2020-08-03 NOTE — ED Notes (Signed)
Pt's husband found a live bat in his shoe, he touched it not knowing what it was and flushed it on the toilet after knowing it was a bat.  This happened on father's day, June 19th.

## 2020-08-03 NOTE — ED Triage Notes (Signed)
She simply states that a dead bat was found in her house. She has no c/o illness and is healthy-looking.

## 2020-08-03 NOTE — Discharge Instructions (Signed)
Please see a separate print out for the follow-up dates for the rest of your rabies series.  You can visit the urgent care to have this done.

## 2020-08-06 ENCOUNTER — Ambulatory Visit (HOSPITAL_COMMUNITY)
Admission: RE | Admit: 2020-08-06 | Discharge: 2020-08-06 | Disposition: A | Discharge status: Home / Self Care | Payer: BC Managed Care – PPO | Source: Ambulatory Visit | Attending: Internal Medicine | Admitting: Internal Medicine

## 2020-08-06 ENCOUNTER — Other Ambulatory Visit: Payer: Self-pay

## 2020-08-06 DIAGNOSIS — Z203 Contact with and (suspected) exposure to rabies: Secondary | ICD-10-CM | POA: Diagnosis not present

## 2020-08-06 MED ORDER — RABIES VACCINE, PCEC IM SUSR
INTRAMUSCULAR | Status: AC
  Filled 2020-08-06: qty 1

## 2020-08-06 MED ORDER — RABIES VACCINE, PCEC IM SUSR
1.0000 mL | Freq: Once | INTRAMUSCULAR | Status: AC
  Administered 2020-08-06: 1 mL via INTRAMUSCULAR

## 2020-08-06 NOTE — ED Triage Notes (Signed)
Pt came in to receive her rabies vaccination.  Pt  received injection in Right deltoid.

## 2020-08-10 ENCOUNTER — Ambulatory Visit (HOSPITAL_COMMUNITY)
Admission: RE | Admit: 2020-08-10 | Discharge: 2020-08-10 | Disposition: A | Discharge status: Home / Self Care | Payer: BC Managed Care – PPO | Source: Ambulatory Visit | Attending: Internal Medicine | Admitting: Internal Medicine

## 2020-08-10 ENCOUNTER — Encounter (HOSPITAL_COMMUNITY): Payer: Self-pay

## 2020-08-10 ENCOUNTER — Other Ambulatory Visit: Payer: Self-pay

## 2020-08-10 ENCOUNTER — Ambulatory Visit (HOSPITAL_COMMUNITY): Payer: BC Managed Care – PPO

## 2020-08-10 DIAGNOSIS — Z203 Contact with and (suspected) exposure to rabies: Secondary | ICD-10-CM | POA: Diagnosis not present

## 2020-08-10 MED ORDER — RABIES VACCINE, PCEC IM SUSR
1.0000 mL | Freq: Once | INTRAMUSCULAR | Status: AC
  Administered 2020-08-10: 1 mL via INTRAMUSCULAR

## 2020-08-10 MED ORDER — TETANUS-DIPHTH-ACELL PERTUSSIS 5-2.5-18.5 LF-MCG/0.5 IM SUSY
PREFILLED_SYRINGE | INTRAMUSCULAR | Status: AC
  Filled 2020-08-10: qty 0.5

## 2020-08-10 NOTE — ED Triage Notes (Signed)
Patient presents for # 3 of Rabies Vaccine, verified previous doses and treatment in chart.

## 2020-08-17 ENCOUNTER — Ambulatory Visit (HOSPITAL_COMMUNITY): Payer: BC Managed Care – PPO

## 2020-08-17 ENCOUNTER — Other Ambulatory Visit: Payer: Self-pay

## 2020-08-17 ENCOUNTER — Ambulatory Visit (HOSPITAL_COMMUNITY)
Admission: RE | Admit: 2020-08-17 | Discharge: 2020-08-17 | Disposition: A | Discharge status: Home / Self Care | Payer: BC Managed Care – PPO | Source: Ambulatory Visit | Attending: Internal Medicine | Admitting: Internal Medicine

## 2020-08-17 DIAGNOSIS — Z203 Contact with and (suspected) exposure to rabies: Secondary | ICD-10-CM | POA: Diagnosis not present

## 2020-08-17 MED ORDER — RABIES VACCINE, PCEC IM SUSR
1.0000 mL | Freq: Once | INTRAMUSCULAR | Status: AC
  Administered 2020-08-17: 1 mL via INTRAMUSCULAR

## 2020-08-17 MED ORDER — RABIES VACCINE, PCEC IM SUSR
INTRAMUSCULAR | Status: AC
  Filled 2020-08-17: qty 1

## 2020-08-17 NOTE — ED Triage Notes (Signed)
Pt presents for #4 of Rabies Injection.

## 2020-11-06 ENCOUNTER — Other Ambulatory Visit: Payer: Self-pay | Admitting: Obstetrics & Gynecology

## 2020-11-07 ENCOUNTER — Ambulatory Visit: Payer: BC Managed Care – PPO | Admitting: Obstetrics & Gynecology

## 2020-11-07 NOTE — Telephone Encounter (Signed)
Annual exam scheduled on 12/08/20

## 2020-12-02 ENCOUNTER — Other Ambulatory Visit: Payer: Self-pay | Admitting: Obstetrics & Gynecology

## 2020-12-02 NOTE — Telephone Encounter (Signed)
Annual exam scheduled on 12/08/20

## 2020-12-08 ENCOUNTER — Other Ambulatory Visit (HOSPITAL_COMMUNITY)
Admission: RE | Admit: 2020-12-08 | Discharge: 2020-12-08 | Disposition: A | Discharge status: Home / Self Care | Payer: BC Managed Care – PPO | Source: Ambulatory Visit | Attending: Obstetrics & Gynecology | Admitting: Obstetrics & Gynecology

## 2020-12-08 ENCOUNTER — Ambulatory Visit (INDEPENDENT_AMBULATORY_CARE_PROVIDER_SITE_OTHER): Payer: BC Managed Care – PPO | Admitting: Obstetrics & Gynecology

## 2020-12-08 ENCOUNTER — Encounter: Payer: Self-pay | Admitting: Obstetrics & Gynecology

## 2020-12-08 ENCOUNTER — Other Ambulatory Visit: Payer: Self-pay

## 2020-12-08 VITALS — BP 120/76 | HR 73 | Resp 16 | Ht 65.0 in | Wt 159.0 lb

## 2020-12-08 DIAGNOSIS — Z79890 Hormone replacement therapy (postmenopausal): Secondary | ICD-10-CM | POA: Diagnosis not present

## 2020-12-08 DIAGNOSIS — Z01419 Encounter for gynecological examination (general) (routine) without abnormal findings: Secondary | ICD-10-CM

## 2020-12-08 DIAGNOSIS — R350 Frequency of micturition: Secondary | ICD-10-CM | POA: Diagnosis not present

## 2020-12-08 DIAGNOSIS — N393 Stress incontinence (female) (male): Secondary | ICD-10-CM

## 2020-12-08 MED ORDER — ESTRADIOL 0.0375 MG/24HR TD PTTW
1.0000 | MEDICATED_PATCH | TRANSDERMAL | 4 refills | Status: DC
Start: 2020-12-08 — End: 2021-12-14

## 2020-12-08 MED ORDER — PROGESTERONE MICRONIZED 100 MG PO CAPS: 100.0000 mg | ORAL_CAPSULE | Freq: Every day | ORAL | 4 refills | Status: DC

## 2020-12-08 NOTE — Progress Notes (Addendum)
Amanda Zuniga [birth year] [id number]   History:    55 y.o.  G4P2A2L2 Married.  Vasectomy.  55 yo and a teenager, both doing well.   RP:  Established patient presenting for annual gyn exam    HPI: Postmenopausal, well on hormone replacement therapy with estradiol patch 0.0375 twice a week and Prometrium 100 mg at bedtime.  No PMB.  No pelvic pain.  No pain with intercourse. Pap Neg in 2019.  Mild SUI.  Not doing Kegels at this time.  Bowel movements normal.  Breast normal.  Screening mammo Neg 12/2019.  Body mass index 25.46.  Good fitness and healthy nutrition.  Health labs with family physician.  Hypercholesterolemia on Fluvastatin XL.  Colono Neg 2015.  Past medical history,surgical history, family history and social history were all reviewed and documented in the EPIC chart.  Gynecologic History Patient's last menstrual period was 06/10/2016 (approximate).  Obstetric History OB History  Gravida Para Term Preterm AB Living  4 2 1 1 2 2   SAB IAB Ectopic Multiple Live Births  1   1   2     # Outcome Date GA Lbr Len/2nd Weight Sex Delivery Anes PTL Lv  4 Ectopic           3 SAB           2 Preterm     M CS-Unspec  Y LIV  1 Term     M Vag-Spont  N LIV     ROS: A ROS was performed and pertinent positives and negatives are included in the history.  GENERAL: No fevers or chills. HEENT: No change in vision, no earache, sore throat or sinus congestion. NECK: No pain or stiffness. CARDIOVASCULAR: No chest pain or pressure. No palpitations. PULMONARY: No shortness of breath, cough or wheeze. GASTROINTESTINAL: No abdominal pain, nausea, vomiting or diarrhea, melena or bright red blood per rectum. GENITOURINARY: No urinary frequency, urgency, hesitancy or dysuria. MUSCULOSKELETAL: No joint or muscle pain, no back pain, no recent trauma. DERMATOLOGIC: No rash, no itching, no lesions. ENDOCRINE: No polyuria, polydipsia, no heat or cold intolerance. No recent change in weight.  HEMATOLOGICAL: No anemia or easy bruising or bleeding. NEUROLOGIC: No headache, seizures, numbness, tingling or weakness. PSYCHIATRIC: No depression, no loss of interest in normal activity or change in sleep pattern.     Exam:   BP 120/76   Pulse 73   Resp 16   Ht 5\' 5"  (1.651 m)   Wt 159 lb (72.1 kg)   LMP 06/10/2016 (Approximate)   BMI 26.46 kg/m   Body mass index is 26.46 kg/m.  General appearance : Well developed well nourished female. No acute distress HEENT: Eyes: no retinal hemorrhage or exudates,  Neck supple, trachea midline, no carotid bruits, no thyroidmegaly Lungs: Clear to auscultation, no rhonchi or wheezes, or rib retractions  Heart: Regular rate and rhythm, no murmurs or gallops Breast:Examined in sitting and supine position were symmetrical in appearance, no palpable masses or tenderness,  no skin retraction, no nipple inversion, no nipple discharge, no skin discoloration, no axillary or supraclavicular lymphadenopathy Abdomen: no palpable masses or tenderness, no rebound or guarding Extremities: no edema or skin discoloration or tenderness  Pelvic: Vulva: Normal             Vagina: No gross lesions or discharge  Cervix: No gross lesions or discharge.  Pap reflex done.  Uterus  AV, normal size, shape and consistency, non-tender and mobile  Adnexa  Without masses or  tenderness  Anus: Normal  U/A: Yellow clear, protein negative, nitrites negative, white blood cells 0-5, red blood cells 3-10, bacteria few.  Urine culture pending.   Assessment/Plan:  55 y.o. female for annual exam   1. Encounter for routine gynecological examination with Papanicolaou smear of cervix Postmenopausal, well on hormone replacement therapy with estradiol patch 0.0375 twice a week and Prometrium 100 mg at bedtime.  No PMB.  No pelvic pain.  No pain with intercourse. Pap Neg in 2019. Pap reflex done today.  Mild SUI.  Not doing Kegels at this time.  Bowel movements normal.  Breast normal.   Screening mammo Neg 12/2019.  Will repeat in 12/2020.  Body mass index 25.46.  Good fitness and healthy nutrition.  Health labs with family physician.  Hypercholesterolemia on Fluvastatin XL.  Colono Neg 2015. - Cytology - PAP( [facility])  2. Postmenopausal hormone replacement therapy Well on HRT.  No PMB.  No CI to continue.  Prescriptions sent to Pharmacy.  3. SUI (stress urinary incontinence, female) Kegel exercises instructed.  Declines PT for Pelvic Floor reinforcement at this time.  4. Urinary frequency Very mild U/A disturbance.  Will wait on U. Culture to decide if ABTx is needed. - Urinalysis,Complete w/RFL Culture   Other orders - progesterone (PROMETRIUM) 100 MG capsule; Take 1 capsule (100 mg total) by mouth at bedtime. - estradiol (VIVELLE-DOT) 0.0375 MG/24HR; Place 1 patch onto the skin 2 (two) times a week.   Princess Bruins MD, 2:43 PM 12/08/2020

## 2020-12-10 LAB — URINALYSIS, COMPLETE W/RFL CULTURE
Bilirubin Urine: NEGATIVE
Glucose, UA: NEGATIVE
Hyaline Cast: NONE SEEN /LPF
Ketones, ur: NEGATIVE
Nitrites, Initial: NEGATIVE
Protein, ur: NEGATIVE
Specific Gravity, Urine: 1.01 (ref 1.001–1.035)
pH: 5.5 (ref 5.0–8.0)

## 2020-12-10 LAB — URINE CULTURE
MICRO NUMBER:: 12620698
Result:: NO GROWTH
SPECIMEN QUALITY:: ADEQUATE

## 2020-12-10 LAB — CULTURE INDICATED

## 2020-12-13 LAB — CYTOLOGY - PAP
Comment: NEGATIVE
Diagnosis: UNDETERMINED — AB
High risk HPV: NEGATIVE

## 2020-12-26 ENCOUNTER — Other Ambulatory Visit: Payer: Self-pay | Admitting: Obstetrics & Gynecology

## 2020-12-26 DIAGNOSIS — Z1231 Encounter for screening mammogram for malignant neoplasm of breast: Secondary | ICD-10-CM

## 2020-12-31 ENCOUNTER — Other Ambulatory Visit: Payer: Self-pay | Admitting: Obstetrics & Gynecology

## 2021-01-02 NOTE — Telephone Encounter (Signed)
Medication refill request: xanax  Last AEX:  12/08/20 Next AEX: nothing scheduled at this time  Last MMG (if hormonal medication request): 01/27/20 density C Bi-rads 1 neg  Refill authorized: #30 with 0 rf pended

## 2021-01-31 ENCOUNTER — Encounter: Payer: Self-pay | Admitting: Family Medicine

## 2021-01-31 ENCOUNTER — Ambulatory Visit
Admission: RE | Admit: 2021-01-31 | Discharge: 2021-01-31 | Disposition: A | Discharge status: Home / Self Care | Payer: 59 | Source: Ambulatory Visit | Attending: Obstetrics & Gynecology | Admitting: Obstetrics & Gynecology

## 2021-01-31 DIAGNOSIS — D485 Neoplasm of uncertain behavior of skin: Secondary | ICD-10-CM | POA: Diagnosis not present

## 2021-01-31 DIAGNOSIS — L570 Actinic keratosis: Secondary | ICD-10-CM | POA: Diagnosis not present

## 2021-01-31 DIAGNOSIS — D225 Melanocytic nevi of trunk: Secondary | ICD-10-CM | POA: Diagnosis not present

## 2021-01-31 DIAGNOSIS — D1801 Hemangioma of skin and subcutaneous tissue: Secondary | ICD-10-CM | POA: Diagnosis not present

## 2021-01-31 DIAGNOSIS — L700 Acne vulgaris: Secondary | ICD-10-CM | POA: Diagnosis not present

## 2021-01-31 DIAGNOSIS — Z1231 Encounter for screening mammogram for malignant neoplasm of breast: Secondary | ICD-10-CM

## 2021-01-31 DIAGNOSIS — C44712 Basal cell carcinoma of skin of right lower limb, including hip: Secondary | ICD-10-CM | POA: Diagnosis not present

## 2021-01-31 DIAGNOSIS — L820 Inflamed seborrheic keratosis: Secondary | ICD-10-CM | POA: Diagnosis not present

## 2021-01-31 DIAGNOSIS — Z85828 Personal history of other malignant neoplasm of skin: Secondary | ICD-10-CM | POA: Diagnosis not present

## 2021-02-02 ENCOUNTER — Other Ambulatory Visit: Payer: Self-pay | Admitting: Obstetrics & Gynecology

## 2021-02-02 DIAGNOSIS — R928 Other abnormal and inconclusive findings on diagnostic imaging of breast: Secondary | ICD-10-CM

## 2021-02-23 ENCOUNTER — Ambulatory Visit
Admission: RE | Admit: 2021-02-23 | Discharge: 2021-02-23 | Disposition: A | Discharge status: Home / Self Care | Payer: 59 | Source: Ambulatory Visit | Attending: Obstetrics & Gynecology | Admitting: Obstetrics & Gynecology

## 2021-02-23 DIAGNOSIS — N6489 Other specified disorders of breast: Secondary | ICD-10-CM | POA: Diagnosis not present

## 2021-02-23 DIAGNOSIS — R928 Other abnormal and inconclusive findings on diagnostic imaging of breast: Secondary | ICD-10-CM

## 2021-02-23 DIAGNOSIS — R922 Inconclusive mammogram: Secondary | ICD-10-CM | POA: Diagnosis not present

## 2021-03-02 ENCOUNTER — Other Ambulatory Visit: Payer: Self-pay | Admitting: Family Medicine

## 2021-03-02 DIAGNOSIS — E782 Mixed hyperlipidemia: Secondary | ICD-10-CM

## 2021-03-16 ENCOUNTER — Encounter: Payer: Self-pay | Admitting: Family Medicine

## 2021-03-16 ENCOUNTER — Ambulatory Visit (INDEPENDENT_AMBULATORY_CARE_PROVIDER_SITE_OTHER): Payer: 59 | Admitting: Family Medicine

## 2021-03-16 VITALS — BP 118/70 | HR 86 | Temp 97.9°F | Ht 65.0 in | Wt 161.0 lb

## 2021-03-16 DIAGNOSIS — Z1159 Encounter for screening for other viral diseases: Secondary | ICD-10-CM

## 2021-03-16 DIAGNOSIS — R635 Abnormal weight gain: Secondary | ICD-10-CM | POA: Diagnosis not present

## 2021-03-16 DIAGNOSIS — Z0000 Encounter for general adult medical examination without abnormal findings: Secondary | ICD-10-CM | POA: Diagnosis not present

## 2021-03-16 DIAGNOSIS — E782 Mixed hyperlipidemia: Secondary | ICD-10-CM

## 2021-03-16 LAB — CBC WITH DIFFERENTIAL/PLATELET
Basophils Absolute: 0 10*3/uL (ref 0.0–0.1)
Basophils Relative: 0.4 % (ref 0.0–3.0)
Eosinophils Absolute: 0 10*3/uL (ref 0.0–0.7)
Eosinophils Relative: 0.4 % (ref 0.0–5.0)
HCT: 42.5 % (ref 36.0–46.0)
Hemoglobin: 14.2 g/dL (ref 12.0–15.0)
Lymphocytes Relative: 23.7 % (ref 12.0–46.0)
Lymphs Abs: 1.5 10*3/uL (ref 0.7–4.0)
MCHC: 33.3 g/dL (ref 30.0–36.0)
MCV: 90.9 fl (ref 78.0–100.0)
Monocytes Absolute: 0.3 10*3/uL (ref 0.1–1.0)
Monocytes Relative: 5.6 % (ref 3.0–12.0)
Neutro Abs: 4.3 10*3/uL (ref 1.4–7.7)
Neutrophils Relative %: 69.9 % (ref 43.0–77.0)
Platelets: 333 10*3/uL (ref 150.0–400.0)
RBC: 4.67 Mil/uL (ref 3.87–5.11)
RDW: 12.6 % (ref 11.5–15.5)
WBC: 6.2 10*3/uL (ref 4.0–10.5)

## 2021-03-16 LAB — TSH: TSH: 1.45 u[IU]/mL (ref 0.35–5.50)

## 2021-03-16 LAB — COMPREHENSIVE METABOLIC PANEL
ALT: 16 U/L (ref 0–35)
AST: 19 U/L (ref 0–37)
Albumin: 4.9 g/dL (ref 3.5–5.2)
Alkaline Phosphatase: 72 U/L (ref 39–117)
BUN: 15 mg/dL (ref 6–23)
CO2: 30 mEq/L (ref 19–32)
Calcium: 9.3 mg/dL (ref 8.4–10.5)
Chloride: 104 mEq/L (ref 96–112)
Creatinine, Ser: 0.81 mg/dL (ref 0.40–1.20)
GFR: 81.5 mL/min (ref >60.00)
Glucose, Bld: 104 mg/dL — ABNORMAL HIGH (ref 70–99)
Potassium: 4.2 mEq/L (ref 3.5–5.1)
Sodium: 138 mEq/L (ref 135–145)
Total Bilirubin: 0.5 mg/dL (ref 0.2–1.2)
Total Protein: 7.7 g/dL (ref 6.0–8.3)

## 2021-03-16 LAB — LIPID PANEL
Cholesterol: 235 mg/dL — ABNORMAL HIGH (ref 0–200)
HDL: 59.8 mg/dL (ref >39.00)
LDL Cholesterol: 148 mg/dL — ABNORMAL HIGH (ref 0–99)
NonHDL: 175.21
Total CHOL/HDL Ratio: 4
Triglycerides: 137 mg/dL (ref 0.0–149.0)
VLDL: 27.4 mg/dL (ref 0.0–40.0)

## 2021-03-16 LAB — T4, FREE: Free T4: 0.89 ng/dL (ref 0.60–1.60)

## 2021-03-16 LAB — HEMOGLOBIN A1C: Hgb A1c MFr Bld: 5.8 % (ref 4.6–6.5)

## 2021-03-16 LAB — VITAMIN D 25 HYDROXY (VIT D DEFICIENCY, FRACTURES): VITD: 18.5 ng/mL — ABNORMAL LOW (ref 30.00–100.00)

## 2021-03-16 MED ORDER — FLUVASTATIN SODIUM ER 80 MG PO TB24: ORAL_TABLET | ORAL | 4 refills | Status: DC

## 2021-03-16 NOTE — Progress Notes (Signed)
Subjective:     Amanda Zuniga is a 56 y.o. female and is here for a comprehensive physical exam. The patient reports she is doing well overall.  She and her husband are now empty nesters as their kids are out of the house.  Walking her new puppy for exercise and recently purchased a rowing machine.  States she is interested in trying to lose weight, about 10-15 lbs.  Working on portion control.  Patient mentions feeling more edematous especially in face at times.  Taking fluvastatin XL 80 mg 3 times per week.  Patient using Xanax 0.25 mg 2 times a week.  Endorses job-related anxiety.  Pt followed by OB/GYN.  Had Pap late 2022.  Had a mammogram in January with follow-up ultrasound.  Patient had colonoscopy at age 75, next 1 due in 21 years.  Had COVID vaccines and 2 boosters as well as influenza vaccine.  Patient inquires about shingles vaccine.  Social History   Socioeconomic History   Marital status: Married    Spouse name: Not on file   Number of children: 2   Years of education: Not on file   Highest education level: Not on file  Occupational History   Occupation: Best boy: [employer]  Tobacco Use   Smoking status: Never   Smokeless tobacco: Never  Vaping Use   Vaping Use: Never used  Substance and Sexual Activity   Alcohol use: Not Currently    Comment: socially   Drug use: No   Sexual activity: Yes    Partners: Male    Birth control/protection: Post-menopausal    Comment: HUSBAND  VASECTOMY  Other Topics Concern   Not on file  Social History Narrative   Not on file   Social Determinants of Health   Financial Resource Strain: Not on file  Food Insecurity: Not on file  Transportation Needs: Not on file  Physical Activity: Not on file  Stress: Not on file  Social Connections: Not on file  Intimate Partner Violence: Not on file   Health Maintenance  Topic Date Due   FOOT EXAM  Never done   OPHTHALMOLOGY EXAM  Never done   URINE MICROALBUMIN  Never done    HIV Screening  Never done   Hepatitis C Screening  Never done   Zoster Vaccines- Shingrix (1 of 2) Never done   HEMOGLOBIN A1C  06/08/2020   INFLUENZA VACCINE  08/29/2020   TETANUS/TDAP  04/22/2022   MAMMOGRAM  02/01/2023   PAP SMEAR-Modifier  12/09/2023   COLONOSCOPY (Pts 45-74yrs Insurance coverage will need to be confirmed)  12/12/2023   HPV VACCINES  Aged Out    The following portions of the patient's history were reviewed and updated as appropriate: allergies, current medications, past family history, past medical history, past social history, past surgical history, and problem list.  Review of Systems Pertinent items noted in HPI and remainder of comprehensive ROS otherwise negative.   Objective:    BP 118/70 (BP Location: Left Arm, Patient Position: Sitting, Cuff Size: Normal)    Pulse 86    Temp 97.9 F (36.6 C) (Oral)    Ht 5\' 5"  (1.651 m)    Wt 161 lb (73 kg)    LMP 06/10/2016 (Approximate)    SpO2 99%    BMI 26.79 kg/m  General appearance: alert, cooperative, and no distress Head: Normocephalic, without obvious abnormality, atraumatic Eyes: conjunctivae/corneas clear. PERRL, EOM's intact. Fundi benign. Ears: normal TM's and external ear canals  both ears Nose: Nares normal. Septum midline. Mucosa normal. No drainage or sinus tenderness. Throat: lips, mucosa, and tongue normal; teeth and gums normal Neck: no adenopathy, no carotid bruit, no JVD, supple, symmetrical, trachea midline, and thyroid not enlarged, symmetric, no tenderness/mass/nodules Lungs: clear to auscultation bilaterally Heart: regular rate and rhythm, S1, S2 normal, no murmur, click, rub or gallop Abdomen: soft, non-tender; bowel sounds normal; no masses,  no organomegaly Extremities: extremities normal, atraumatic, no cyanosis or edema Pulses: 2+ and symmetric Skin: Skin color, texture, turgor normal. No rashes or lesions Lymph nodes: Cervical, supraclavicular, and axillary nodes normal. Neurologic:  Alert and oriented X 3, normal strength and tone. Normal symmetric reflexes. Normal coordination and gait    Assessment:    Healthy female exam.      Plan:    Anticipatory guidance given including wearing seatbelts, smoke detectors in the home, increasing physical activity, increasing p.o. intake of water and vegetables. -labs -Mammogram up-to-date, done 01/2021 -Pap up-to-date done by OB/GYN 12/08/2020 -Colonoscopy done at age 4 with 10-year recall.  Due in 2026 -Immunizations reviewed -Next CPE 1 year See After Visit Summary for Counseling Recommendations   Encounter for hepatitis C screening test for low risk patient  - Plan: Hep C Antibody  Mixed hyperlipidemia  -Lifestyle modifications -Continue statin 3 times per week - Plan: Lipid panel, CMP  Weight gain  -Body mass index is 26.79 kg/m. -Discussed lifestyle modifications - Plan: TSH, T4, Free, Hemoglobin A1c, Vitamin D, 25-hydroxy  Follow-up as needed  Grier Mitts, MD

## 2021-03-17 ENCOUNTER — Other Ambulatory Visit: Payer: Self-pay | Admitting: Family Medicine

## 2021-03-17 ENCOUNTER — Encounter: Payer: Self-pay | Admitting: Family Medicine

## 2021-03-17 DIAGNOSIS — E559 Vitamin D deficiency, unspecified: Secondary | ICD-10-CM

## 2021-03-17 LAB — HEPATITIS C ANTIBODY
Hepatitis C Ab: NONREACTIVE
SIGNAL TO CUT-OFF: <0.02 (ref <1.00)

## 2021-03-17 MED ORDER — VITAMIN D (ERGOCALCIFEROL) 1.25 MG (50000 UNIT) PO CAPS: 50000.0000 [IU] | ORAL_CAPSULE | ORAL | 0 refills | Status: DC

## 2021-03-25 DIAGNOSIS — Z23 Encounter for immunization: Secondary | ICD-10-CM | POA: Diagnosis not present

## 2021-03-31 ENCOUNTER — Other Ambulatory Visit: Payer: Self-pay | Admitting: Obstetrics & Gynecology

## 2021-03-31 NOTE — Telephone Encounter (Signed)
Last annual exam was 11/2020  

## 2021-05-03 DIAGNOSIS — C44319 Basal cell carcinoma of skin of other parts of face: Secondary | ICD-10-CM | POA: Diagnosis not present

## 2021-05-03 DIAGNOSIS — Z85828 Personal history of other malignant neoplasm of skin: Secondary | ICD-10-CM | POA: Diagnosis not present

## 2021-05-03 DIAGNOSIS — C4441 Basal cell carcinoma of skin of scalp and neck: Secondary | ICD-10-CM | POA: Diagnosis not present

## 2021-05-31 DIAGNOSIS — M25511 Pain in right shoulder: Secondary | ICD-10-CM | POA: Diagnosis not present

## 2021-05-31 DIAGNOSIS — M67911 Unspecified disorder of synovium and tendon, right shoulder: Secondary | ICD-10-CM | POA: Diagnosis not present

## 2021-06-08 DIAGNOSIS — Z85828 Personal history of other malignant neoplasm of skin: Secondary | ICD-10-CM | POA: Diagnosis not present

## 2021-06-08 DIAGNOSIS — C4441 Basal cell carcinoma of skin of scalp and neck: Secondary | ICD-10-CM | POA: Diagnosis not present

## 2021-06-11 ENCOUNTER — Other Ambulatory Visit: Payer: Self-pay | Admitting: Obstetrics & Gynecology

## 2021-06-12 NOTE — Telephone Encounter (Signed)
Last AEX 12/08/20.  ?

## 2021-06-29 DIAGNOSIS — M7501 Adhesive capsulitis of right shoulder: Secondary | ICD-10-CM | POA: Diagnosis not present

## 2021-08-09 DIAGNOSIS — D2272 Melanocytic nevi of left lower limb, including hip: Secondary | ICD-10-CM | POA: Diagnosis not present

## 2021-08-09 DIAGNOSIS — D2271 Melanocytic nevi of right lower limb, including hip: Secondary | ICD-10-CM | POA: Diagnosis not present

## 2021-08-09 DIAGNOSIS — D225 Melanocytic nevi of trunk: Secondary | ICD-10-CM | POA: Diagnosis not present

## 2021-08-09 DIAGNOSIS — L821 Other seborrheic keratosis: Secondary | ICD-10-CM | POA: Diagnosis not present

## 2021-08-09 DIAGNOSIS — Z85828 Personal history of other malignant neoplasm of skin: Secondary | ICD-10-CM | POA: Diagnosis not present

## 2021-08-09 DIAGNOSIS — C44519 Basal cell carcinoma of skin of other part of trunk: Secondary | ICD-10-CM | POA: Diagnosis not present

## 2021-08-11 DIAGNOSIS — Z23 Encounter for immunization: Secondary | ICD-10-CM | POA: Diagnosis not present

## 2021-08-17 ENCOUNTER — Other Ambulatory Visit: Payer: Self-pay | Admitting: Obstetrics & Gynecology

## 2021-08-17 NOTE — Telephone Encounter (Signed)
AEX 12/08/2020

## 2021-11-27 DIAGNOSIS — Z85828 Personal history of other malignant neoplasm of skin: Secondary | ICD-10-CM | POA: Diagnosis not present

## 2021-11-27 DIAGNOSIS — L570 Actinic keratosis: Secondary | ICD-10-CM | POA: Diagnosis not present

## 2021-11-27 DIAGNOSIS — D225 Melanocytic nevi of trunk: Secondary | ICD-10-CM | POA: Diagnosis not present

## 2021-11-27 DIAGNOSIS — D2272 Melanocytic nevi of left lower limb, including hip: Secondary | ICD-10-CM | POA: Diagnosis not present

## 2021-11-27 DIAGNOSIS — L821 Other seborrheic keratosis: Secondary | ICD-10-CM | POA: Diagnosis not present

## 2021-12-12 ENCOUNTER — Other Ambulatory Visit: Payer: Self-pay | Admitting: Obstetrics & Gynecology

## 2021-12-14 ENCOUNTER — Ambulatory Visit (INDEPENDENT_AMBULATORY_CARE_PROVIDER_SITE_OTHER): Payer: 59 | Admitting: Obstetrics & Gynecology

## 2021-12-14 ENCOUNTER — Encounter: Payer: Self-pay | Admitting: Obstetrics & Gynecology

## 2021-12-14 ENCOUNTER — Other Ambulatory Visit (HOSPITAL_COMMUNITY)
Admission: RE | Admit: 2021-12-14 | Discharge: 2021-12-14 | Disposition: A | Discharge status: Home / Self Care | Payer: 59 | Source: Ambulatory Visit | Attending: Obstetrics & Gynecology | Admitting: Obstetrics & Gynecology

## 2021-12-14 VITALS — BP 110/64 | HR 98 | Ht 64.75 in | Wt 160.0 lb

## 2021-12-14 DIAGNOSIS — Z01419 Encounter for gynecological examination (general) (routine) without abnormal findings: Secondary | ICD-10-CM

## 2021-12-14 DIAGNOSIS — R87610 Atypical squamous cells of undetermined significance on cytologic smear of cervix (ASC-US): Secondary | ICD-10-CM | POA: Insufficient documentation

## 2021-12-14 DIAGNOSIS — Z79890 Hormone replacement therapy (postmenopausal): Secondary | ICD-10-CM | POA: Diagnosis not present

## 2021-12-14 MED ORDER — PROGESTERONE MICRONIZED 100 MG PO CAPS: 100.0000 mg | ORAL_CAPSULE | Freq: Every day | ORAL | 4 refills | Status: DC

## 2021-12-14 MED ORDER — ESTRADIOL 0.0375 MG/24HR TD PTTW: 1.0000 | MEDICATED_PATCH | TRANSDERMAL | 4 refills | Status: DC

## 2021-12-14 NOTE — Progress Notes (Signed)
Amanda Zuniga 1965/11/26 [id number]   History:    56 y.o. G4P2A2L2 Married.  Vasectomy.  Boys 56 yo and 55 yo, both doing well.   RP:  Established patient presenting for annual gyn exam    HPI: Postmenopausal, well on hormone replacement therapy x 2017 with estradiol patch 0.0375 twice a week and Prometrium 100 mg at bedtime.  No PMB.  No pelvic pain.  No pain with intercourse. Pap ASCUS/HPV HR Neg in 11/2020.  Urine normal.  Bowel movements normal.  Breast normal.  Screening mammo 01/31/2021, Lt Dx Mammo/US Neg 02/23/21.  Body mass index 26.83.  Good fitness and healthy nutrition.  Health labs with family physician. Hypercholesterolemia on Fluvastatin XL.  Colono Neg 2015.  Past medical history,surgical history, family history and social history were all reviewed and documented in the EPIC chart.  Gynecologic History Patient's last menstrual period was 06/10/2016 (approximate).  Obstetric History OB History  Gravida Para Term Preterm AB Living  '4 2 1 1 2 2  '$ SAB IAB Ectopic Multiple Live Births  '1   1   2    '$ # Outcome Date GA Lbr Len/2nd Weight Sex Delivery Anes PTL Lv  4 Ectopic           3 SAB           2 Preterm     M CS-Unspec  Y LIV  1 Term     M Vag-Spont  N LIV     ROS: A ROS was performed and pertinent positives and negatives are included in the history. GENERAL: No fevers or chills. HEENT: No change in vision, no earache, sore throat or sinus congestion. NECK: No pain or stiffness. CARDIOVASCULAR: No chest pain or pressure. No palpitations. PULMONARY: No shortness of breath, cough or wheeze. GASTROINTESTINAL: No abdominal pain, nausea, vomiting or diarrhea, melena or bright red blood per rectum. GENITOURINARY: No urinary frequency, urgency, hesitancy or dysuria. MUSCULOSKELETAL: No joint or muscle pain, no back pain, no recent trauma. DERMATOLOGIC: No rash, no itching, no lesions. ENDOCRINE: No polyuria, polydipsia, no heat or cold intolerance. No recent change in  weight. HEMATOLOGICAL: No anemia or easy bruising or bleeding. NEUROLOGIC: No headache, seizures, numbness, tingling or weakness. PSYCHIATRIC: No depression, no loss of interest in normal activity or change in sleep pattern.     Exam:   BP 110/64   Pulse 98   Ht 5' 4.75" (1.645 m)   Wt 160 lb (72.6 kg)   LMP 06/10/2016 (Approximate) Comment: husband vasectomy  SpO2 97%   BMI 26.83 kg/m   Body mass index is 26.83 kg/m.  General appearance : Well developed well nourished female. No acute distress HEENT: Eyes: no retinal hemorrhage or exudates,  Neck supple, trachea midline, no carotid bruits, no thyroidmegaly Lungs: Clear to auscultation, no rhonchi or wheezes, or rib retractions  Heart: Regular rate and rhythm, no murmurs or gallops Breast:Examined in sitting and supine position were symmetrical in appearance, no palpable masses or tenderness,  no skin retraction, no nipple inversion, no nipple discharge, no skin discoloration, no axillary or supraclavicular lymphadenopathy Abdomen: no palpable masses or tenderness, no rebound or guarding Extremities: no edema or skin discoloration or tenderness  Pelvic: Vulva: Normal             Vagina: No gross lesions or discharge  Cervix: No gross lesions or discharge.  Pap reflex done.  Uterus  AV, normal size, shape and consistency, non-tender and mobile  Adnexa  Without masses  or tenderness  Anus: Normal   Assessment/Plan:  56 y.o. female for annual exam   1. Encounter for routine gynecological examination with Papanicolaou smear of cervix Postmenopausal, well on hormone replacement therapy x 2017 with estradiol patch 0.0375 twice a week and Prometrium 100 mg at bedtime.  No PMB.  No pelvic pain.  No pain with intercourse. Pap ASCUS/HPV HR Neg in 11/2020.  Urine normal.  Bowel movements normal.  Breast normal.  Screening mammo 01/31/2021, Lt Dx Mammo/US Neg 02/23/21.  Body mass index 26.83.  Good fitness and healthy nutrition.  Health labs  with family physician. Hypercholesterolemia on Fluvastatin XL.  Colono Neg 2015. - Cytology - PAP( [facility])  2. ASCUS of cervix with negative high risk HPV - Cytology - PAP( [facility])  3. Postmenopausal hormone replacement therapy Postmenopausal, well on hormone replacement therapy x 2017 with estradiol patch 0.0375 twice a week and Prometrium 100 mg at bedtime.  No PMB.  No pelvic pain.  No pain with intercourse. No CI to continue.  Prescription sent to pharmacy.  Other orders - VITAMIN D PO; Take by mouth. - progesterone (PROMETRIUM) 100 MG capsule; Take 1 capsule (100 mg total) by mouth at bedtime. - estradiol (VIVELLE-DOT) 0.0375 MG/24HR; Place 1 patch onto the skin 2 (two) times a week.   Princess Bruins MD, 4:10 PM 12/14/2021

## 2021-12-20 LAB — CYTOLOGY - PAP
Diagnosis: NEGATIVE
Diagnosis: REACTIVE

## 2022-01-12 ENCOUNTER — Telehealth: Payer: Self-pay

## 2022-01-12 ENCOUNTER — Other Ambulatory Visit: Payer: 59

## 2022-01-12 DIAGNOSIS — R3915 Urgency of urination: Secondary | ICD-10-CM | POA: Diagnosis not present

## 2022-01-12 DIAGNOSIS — R350 Frequency of micturition: Secondary | ICD-10-CM

## 2022-01-12 MED ORDER — SULFAMETHOXAZOLE-TRIMETHOPRIM 800-160 MG PO TABS: 1.0000 | ORAL_TABLET | Freq: Two times a day (BID) | ORAL | 0 refills | Status: AC

## 2022-01-12 NOTE — Telephone Encounter (Signed)
Pt calling to report UTI sxs of urinary urgency, frequency and just general discomfort. Denies dysuria yet but has hx of UTIs and feels as if this is what it is. Currently taking OTC meds to help w/ sxs.   Ok for pt to come in for lab/nurse visit only today to leave sample or would you like to see her as well? Please advise.

## 2022-01-12 NOTE — Progress Notes (Signed)
I called patient & got no answer. I left detailed information letting her know that her urine did show a urinary infection & that it will be sent for a culture. Bactrim sent to the pharmacy & to increase water.

## 2022-01-12 NOTE — Progress Notes (Signed)
Lab visit for urinary urgency and frequency.  U/A: Yellow cloudy, Nit Neg, Pro Neg, WBC 10-20, RBC 0-2, Bacteria Moderate.  U. Culture pending.  NKDA  Treat with Bactrim DS 1 tab PO BID x 3 days.  Prescription sent to pharmacy, patient informed.

## 2022-01-12 NOTE — Telephone Encounter (Signed)
Per ML: "No room on my schedule at all today unfortunately.  Nurse visit only or Urgent Care."  Pt opted to come in for nurse visit to leave sample.  Pt placed on nurse visit schedule for 1:45pm.   Will place order for UA/ucx.

## 2022-01-15 ENCOUNTER — Encounter: Payer: Self-pay | Admitting: Obstetrics & Gynecology

## 2022-01-15 LAB — URINE CULTURE
MICRO NUMBER:: 14321342
SPECIMEN QUALITY:: ADEQUATE

## 2022-01-15 LAB — URINALYSIS, COMPLETE W/RFL CULTURE
Bilirubin Urine: NEGATIVE
Glucose, UA: NEGATIVE
Hyaline Cast: NONE SEEN /LPF
Ketones, ur: NEGATIVE
Nitrites, Initial: NEGATIVE
Protein, ur: NEGATIVE
Specific Gravity, Urine: 1.01 (ref 1.001–1.035)
pH: 5.5 (ref 5.0–8.0)

## 2022-01-15 LAB — CULTURE INDICATED

## 2022-01-16 MED ORDER — FLUCONAZOLE 150 MG PO TABS: ORAL_TABLET | ORAL | 0 refills | Status: DC

## 2022-01-16 MED ORDER — AMOXICILLIN 500 MG PO TABS: 500.0000 mg | ORAL_TABLET | Freq: Three times a day (TID) | ORAL | 0 refills | Status: DC

## 2022-01-16 NOTE — Telephone Encounter (Signed)
Dr.Lavoie patient it requesting yeast medication post Rx? Please advise

## 2022-01-16 NOTE — Telephone Encounter (Signed)
yes, Fluconazole 150 mg 1 tab PO every other day x 3.

## 2022-01-16 NOTE — Telephone Encounter (Signed)
  U. Culture: Staph. Aureus.  Treated with Bactrim DS x 3 days.  Not the best ABTx for Staph. Aureus.  Recommend treating with Amoxicillin 500 mg PO TID x 7 days.  Please send prescription.

## 2022-01-30 ENCOUNTER — Telehealth: Payer: Self-pay

## 2022-01-30 DIAGNOSIS — R350 Frequency of micturition: Secondary | ICD-10-CM

## 2022-01-30 DIAGNOSIS — R3915 Urgency of urination: Secondary | ICD-10-CM

## 2022-01-30 NOTE — Telephone Encounter (Signed)
I spoke with Dr. Marguerita Merles and she recommended rechecking a u/a.  Also, said I can offer patient a Diflucan tablet Rx if she wants for yeast infection.  Patient declined the Diflucan stating no vaginal sx at all.  She scheduled lab appt for tomorrow at 12:00pm to come by and leave u/s for TOC.

## 2022-01-30 NOTE — Telephone Encounter (Signed)
Patient was treated 01/16/22 for urine culture with staph aureus with Amoxicillin 500 mg PO TID x 7 days,  She completed the Rx and still feels like UTI has not cleared completely. C/o still with slight urgency feeling and some slight frequency with a little bit of discomfort with urination. Not in pain like before.  What to recommend?

## 2022-01-31 ENCOUNTER — Other Ambulatory Visit: Payer: 59

## 2022-01-31 DIAGNOSIS — R3915 Urgency of urination: Secondary | ICD-10-CM

## 2022-01-31 DIAGNOSIS — R350 Frequency of micturition: Secondary | ICD-10-CM

## 2022-02-01 ENCOUNTER — Telehealth: Payer: Self-pay

## 2022-02-01 LAB — URINALYSIS, COMPLETE W/RFL CULTURE
Bacteria, UA: NONE SEEN /HPF
Bilirubin Urine: NEGATIVE
Casts: NONE SEEN /LPF
Crystals: NONE SEEN /HPF
Glucose, UA: NEGATIVE
Hyaline Cast: NONE SEEN /LPF
Ketones, ur: NEGATIVE
Leukocyte Esterase: NEGATIVE
Nitrites, Initial: NEGATIVE
Protein, ur: NEGATIVE
Specific Gravity, Urine: 1.01 (ref 1.001–1.035)
Yeast: NONE SEEN /HPF
pH: 6.5 (ref 5.0–8.0)

## 2022-02-01 LAB — URINE CULTURE
MICRO NUMBER:: 14382574
SPECIMEN QUALITY:: ADEQUATE

## 2022-02-01 LAB — CULTURE INDICATED

## 2022-02-01 NOTE — Telephone Encounter (Signed)
Urine Negative for Cystitis. No need to give Antibiotics. Pt notified and voiced understanding.

## 2022-02-01 NOTE — Telephone Encounter (Signed)
Pt calling to inquire about urinalysis results from yesterday. She noticed some flags but wasn't sure what they meant and is inquiring if an abx is needed at this point. Please advise.

## 2022-02-19 ENCOUNTER — Other Ambulatory Visit: Payer: Self-pay | Admitting: Obstetrics & Gynecology

## 2022-02-20 ENCOUNTER — Other Ambulatory Visit: Payer: Self-pay | Admitting: Obstetrics & Gynecology

## 2022-02-20 DIAGNOSIS — Z1231 Encounter for screening mammogram for malignant neoplasm of breast: Secondary | ICD-10-CM

## 2022-02-20 NOTE — Telephone Encounter (Signed)
AEX 12/14/21.

## 2022-03-19 ENCOUNTER — Encounter: Payer: 59 | Admitting: Family Medicine

## 2022-03-19 DIAGNOSIS — Z85828 Personal history of other malignant neoplasm of skin: Secondary | ICD-10-CM | POA: Diagnosis not present

## 2022-03-19 DIAGNOSIS — D225 Melanocytic nevi of trunk: Secondary | ICD-10-CM | POA: Diagnosis not present

## 2022-03-19 DIAGNOSIS — L821 Other seborrheic keratosis: Secondary | ICD-10-CM | POA: Diagnosis not present

## 2022-03-19 DIAGNOSIS — L700 Acne vulgaris: Secondary | ICD-10-CM | POA: Diagnosis not present

## 2022-03-19 DIAGNOSIS — D2272 Melanocytic nevi of left lower limb, including hip: Secondary | ICD-10-CM | POA: Diagnosis not present

## 2022-03-26 ENCOUNTER — Encounter: Payer: Self-pay | Admitting: Family Medicine

## 2022-03-26 ENCOUNTER — Ambulatory Visit (INDEPENDENT_AMBULATORY_CARE_PROVIDER_SITE_OTHER): Payer: 59 | Admitting: Family Medicine

## 2022-03-26 VITALS — BP 120/60 | HR 91 | Temp 97.9°F | Ht 64.96 in | Wt 164.0 lb

## 2022-03-26 DIAGNOSIS — E782 Mixed hyperlipidemia: Secondary | ICD-10-CM

## 2022-03-26 DIAGNOSIS — Z0000 Encounter for general adult medical examination without abnormal findings: Secondary | ICD-10-CM

## 2022-03-26 DIAGNOSIS — R3989 Other symptoms and signs involving the genitourinary system: Secondary | ICD-10-CM

## 2022-03-26 DIAGNOSIS — R7303 Prediabetes: Secondary | ICD-10-CM | POA: Diagnosis not present

## 2022-03-26 DIAGNOSIS — R635 Abnormal weight gain: Secondary | ICD-10-CM

## 2022-03-26 DIAGNOSIS — Z0001 Encounter for general adult medical examination with abnormal findings: Secondary | ICD-10-CM | POA: Diagnosis not present

## 2022-03-26 DIAGNOSIS — E559 Vitamin D deficiency, unspecified: Secondary | ICD-10-CM

## 2022-03-26 LAB — COMPREHENSIVE METABOLIC PANEL
ALT: 20 U/L (ref 0–35)
AST: 17 U/L (ref 0–37)
Albumin: 4.2 g/dL (ref 3.5–5.2)
Alkaline Phosphatase: 74 U/L (ref 39–117)
BUN: 17 mg/dL (ref 6–23)
CO2: 27 mEq/L (ref 19–32)
Calcium: 9.3 mg/dL (ref 8.4–10.5)
Chloride: 103 mEq/L (ref 96–112)
Creatinine, Ser: 0.77 mg/dL (ref 0.40–1.20)
GFR: 85.98 mL/min (ref >60.00)
Glucose, Bld: 94 mg/dL (ref 70–99)
Potassium: 4 mEq/L (ref 3.5–5.1)
Sodium: 139 mEq/L (ref 135–145)
Total Bilirubin: 0.5 mg/dL (ref 0.2–1.2)
Total Protein: 7.2 g/dL (ref 6.0–8.3)

## 2022-03-26 LAB — CBC WITH DIFFERENTIAL/PLATELET
Basophils Absolute: 0 10*3/uL (ref 0.0–0.1)
Basophils Relative: 0.2 % (ref 0.0–3.0)
Eosinophils Absolute: 0 10*3/uL (ref 0.0–0.7)
Eosinophils Relative: 0.5 % (ref 0.0–5.0)
HCT: 40.6 % (ref 36.0–46.0)
Hemoglobin: 13.7 g/dL (ref 12.0–15.0)
Lymphocytes Relative: 27.5 % (ref 12.0–46.0)
Lymphs Abs: 1.9 10*3/uL (ref 0.7–4.0)
MCHC: 33.9 g/dL (ref 30.0–36.0)
MCV: 89.9 fl (ref 78.0–100.0)
Monocytes Absolute: 0.5 10*3/uL (ref 0.1–1.0)
Monocytes Relative: 6.5 % (ref 3.0–12.0)
Neutro Abs: 4.6 10*3/uL (ref 1.4–7.7)
Neutrophils Relative %: 65.3 % (ref 43.0–77.0)
Platelets: 343 10*3/uL (ref 150.0–400.0)
RBC: 4.51 Mil/uL (ref 3.87–5.11)
RDW: 13 % (ref 11.5–15.5)
WBC: 7.1 10*3/uL (ref 4.0–10.5)

## 2022-03-26 LAB — LIPID PANEL
Cholesterol: 225 mg/dL — ABNORMAL HIGH (ref 0–200)
HDL: 55.6 mg/dL (ref >39.00)
LDL Cholesterol: 131 mg/dL — ABNORMAL HIGH (ref 0–99)
NonHDL: 169.8
Total CHOL/HDL Ratio: 4
Triglycerides: 194 mg/dL — ABNORMAL HIGH (ref 0.0–149.0)
VLDL: 38.8 mg/dL (ref 0.0–40.0)

## 2022-03-26 LAB — POCT URINALYSIS DIPSTICK
Bilirubin, UA: NEGATIVE
Blood, UA: POSITIVE
Glucose, UA: NEGATIVE
Ketones, UA: NEGATIVE
Nitrite, UA: POSITIVE
Protein, UA: NEGATIVE
Spec Grav, UA: 1.015 (ref 1.010–1.025)
Urobilinogen, UA: 0.2 E.U./dL
pH, UA: 5.5 (ref 5.0–8.0)

## 2022-03-26 LAB — TSH: TSH: 1.22 u[IU]/mL (ref 0.35–5.50)

## 2022-03-26 LAB — HEMOGLOBIN A1C: Hgb A1c MFr Bld: 5.9 % (ref 4.6–6.5)

## 2022-03-26 LAB — VITAMIN D 25 HYDROXY (VIT D DEFICIENCY, FRACTURES): VITD: 24.08 ng/mL — ABNORMAL LOW (ref 30.00–100.00)

## 2022-03-26 MED ORDER — FLUVASTATIN SODIUM ER 80 MG PO TB24: ORAL_TABLET | ORAL | 4 refills | Status: DC

## 2022-03-26 NOTE — Progress Notes (Signed)
Established Patient Office Visit   Subjective  Patient ID: Amanda Zuniga, female    DOB: 1965-02-17  Age: 57 y.o. MRN: [id number]  No chief complaint on file.   Pt is a 57 yo female seen for CPE.  Pt states she is doing well.  Staying busy working from home.  Walking 3 miles 3x/wk with weighted ruck sack.  Pt interested in losing wt.  May eat one meal-a late brunch on the wknd.  Patient endorses sensation of bladder pressure increased in the last 3 days.  Patient started taking Azo.  Endorses having bladder infection last month for which antibiotic had to be switched.  Patient followed by OB/GYN for Pap.  Colonoscopy done 12/11/2013.      ROS Negative unless stated above    Objective:     BP 120/60 (BP Location: Left Arm, Patient Position: Sitting, Cuff Size: Normal)   Pulse 91   Temp 97.9 F (36.6 C) (Oral)   Ht 5' 4.96" (1.65 m)   Wt 164 lb (74.4 kg)   LMP 06/10/2016 (Approximate) Comment: husband vasectomy  SpO2 96%   BMI 27.32 kg/m    Physical Exam Constitutional:      Appearance: Normal appearance.  HENT:     Head: Normocephalic and atraumatic.     Right Ear: Tympanic membrane, ear canal and external ear normal.     Left Ear: Tympanic membrane, ear canal and external ear normal.     Nose: Nose normal.     Mouth/Throat:     Mouth: Mucous membranes are moist.     Pharynx: No oropharyngeal exudate or posterior oropharyngeal erythema.  Eyes:     General: No scleral icterus.    Extraocular Movements: Extraocular movements intact.     Conjunctiva/sclera: Conjunctivae normal.     Pupils: Pupils are equal, round, and reactive to light.  Neck:     Thyroid: No thyromegaly.  Cardiovascular:     Rate and Rhythm: Normal rate and regular rhythm.     Pulses: Normal pulses.     Heart sounds: Normal heart sounds. No murmur heard.    No friction rub.  Pulmonary:     Effort: Pulmonary effort is normal.     Breath sounds: Normal breath sounds. No wheezing, rhonchi  or rales.  Abdominal:     General: Bowel sounds are normal.     Palpations: Abdomen is soft.     Tenderness: There is no abdominal tenderness.  Musculoskeletal:        General: No deformity. Normal range of motion.  Lymphadenopathy:     Cervical: No cervical adenopathy.  Skin:    General: Skin is warm and dry.     Findings: No lesion.  Neurological:     General: No focal deficit present.     Mental Status: She is alert and oriented to person, place, and time.  Psychiatric:        Mood and Affect: Mood normal.        Thought Content: Thought content normal.      No results found for any visits on 03/26/22.    Assessment & Plan:  Well adult exam -Anticipatory guidance given including wearing seatbelts, smoke detectors in the home, increasing physical activity, increasing p.o. intake of water and vegetables. -labs -mammogram done 02/23/21 -Colonoscopy done 12/11/2013. -pap done 12/14/21 with OB/Gyn -immunizations reviewed -next CPE in 1 yr  Mixed hyperlipidemia -continue lifestyle modifications -Continue fluvastatin XL 80 mg -     Fluvastatin Sodium  ER; TAKE 1 TABLET BY MOUTH THREE TIMES A WEEK  Dispense: 36 tablet; Refill: 4 -     Comprehensive metabolic panel -     Lipid panel  Sensation of pressure in bladder area -Given recent UTI will await culture results before prescribing ABX. -Increase fluid intake -     POCT urinalysis dipstick -     Comprehensive metabolic panel -     CBC with Differential/Platelet - UCx  Vitamin D deficiency -     VITAMIN D 25 Hydroxy (Vit-D Deficiency, Fractures)  Weight gain -Body mass index is 27.32 kg/m. -continue lifestyle modifications -     Hemoglobin A1c -     TSH  PreDM -Hemoglobin A1c 5.8% on 03/16/2021 -hgb A1C   Return if symptoms worsen or fail to improve.   Billie Ruddy, MD

## 2022-03-28 ENCOUNTER — Telehealth: Payer: Self-pay | Admitting: Family Medicine

## 2022-03-28 ENCOUNTER — Other Ambulatory Visit: Payer: Self-pay | Admitting: Family Medicine

## 2022-03-28 DIAGNOSIS — E559 Vitamin D deficiency, unspecified: Secondary | ICD-10-CM

## 2022-03-28 DIAGNOSIS — R7303 Prediabetes: Secondary | ICD-10-CM | POA: Insufficient documentation

## 2022-03-28 DIAGNOSIS — N3000 Acute cystitis without hematuria: Secondary | ICD-10-CM

## 2022-03-28 LAB — URINE CULTURE
MICRO NUMBER:: 14613757
SPECIMEN QUALITY:: ADEQUATE

## 2022-03-28 MED ORDER — VITAMIN D (ERGOCALCIFEROL) 1.25 MG (50000 UNIT) PO CAPS: 50000.0000 [IU] | ORAL_CAPSULE | ORAL | 0 refills | Status: DC

## 2022-03-28 MED ORDER — NITROFURANTOIN MONOHYD MACRO 100 MG PO CAPS: 100.0000 mg | ORAL_CAPSULE | Freq: Two times a day (BID) | ORAL | 0 refills | Status: AC

## 2022-03-28 NOTE — Telephone Encounter (Signed)
Pt called to say she saw her results on Mychart and she is wondering if she needs to start an antibiotic?  Pt was informed MD is OOO today.  Please call Pt back at your earliest convenience.

## 2022-03-28 NOTE — Telephone Encounter (Signed)
Please refer to result note.

## 2022-03-29 NOTE — Telephone Encounter (Signed)
Noted  

## 2022-04-10 ENCOUNTER — Ambulatory Visit: Payer: 59

## 2022-04-19 ENCOUNTER — Ambulatory Visit
Admission: RE | Admit: 2022-04-19 | Discharge: 2022-04-19 | Disposition: A | Discharge status: Home / Self Care | Payer: 59 | Source: Ambulatory Visit | Attending: Obstetrics & Gynecology | Admitting: Obstetrics & Gynecology

## 2022-04-19 DIAGNOSIS — Z1231 Encounter for screening mammogram for malignant neoplasm of breast: Secondary | ICD-10-CM | POA: Diagnosis not present

## 2022-04-23 ENCOUNTER — Other Ambulatory Visit: Payer: Self-pay | Admitting: Obstetrics & Gynecology

## 2022-04-23 DIAGNOSIS — R928 Other abnormal and inconclusive findings on diagnostic imaging of breast: Secondary | ICD-10-CM

## 2022-04-26 ENCOUNTER — Other Ambulatory Visit: Payer: Self-pay | Admitting: Family Medicine

## 2022-04-26 ENCOUNTER — Telehealth: Payer: Self-pay | Admitting: Family Medicine

## 2022-04-26 DIAGNOSIS — E782 Mixed hyperlipidemia: Secondary | ICD-10-CM

## 2022-04-26 MED ORDER — FLUVASTATIN SODIUM ER 80 MG PO TB24: ORAL_TABLET | ORAL | 3 refills | Status: DC

## 2022-04-26 NOTE — Telephone Encounter (Signed)
  fluvastatin XL (LESCOL XL) 80 MG 24 hr tablet was increased from 3xweek to [place] and has ran out.   [id number] - [place], [place] - [address]. [address] Phone: [phone]  Fax: [phone]

## 2022-04-26 NOTE — Telephone Encounter (Signed)
Prescription sent to pharmacy.

## 2022-04-30 ENCOUNTER — Other Ambulatory Visit: Payer: Self-pay | Admitting: Obstetrics & Gynecology

## 2022-04-30 NOTE — Telephone Encounter (Signed)
Med refill request: Xanax  Last AEX:12/14/21 Next AEX: not scheduled Last MMG (if hormonal med) Refill authorized: Please Advise?

## 2022-04-30 NOTE — Telephone Encounter (Signed)
Attempted to reach pt. Left a detail message that med was sent.

## 2022-05-02 ENCOUNTER — Other Ambulatory Visit: Payer: Self-pay | Admitting: Obstetrics & Gynecology

## 2022-05-02 ENCOUNTER — Ambulatory Visit
Admission: RE | Admit: 2022-05-02 | Discharge: 2022-05-02 | Disposition: A | Discharge status: Home / Self Care | Payer: 59 | Source: Ambulatory Visit | Attending: Obstetrics & Gynecology | Admitting: Obstetrics & Gynecology

## 2022-05-02 DIAGNOSIS — N6489 Other specified disorders of breast: Secondary | ICD-10-CM

## 2022-05-02 DIAGNOSIS — R928 Other abnormal and inconclusive findings on diagnostic imaging of breast: Secondary | ICD-10-CM

## 2022-05-07 ENCOUNTER — Ambulatory Visit
Admission: RE | Admit: 2022-05-07 | Discharge: 2022-05-07 | Disposition: A | Discharge status: Home / Self Care | Payer: 59 | Source: Ambulatory Visit | Attending: Obstetrics & Gynecology | Admitting: Obstetrics & Gynecology

## 2022-05-07 DIAGNOSIS — R928 Other abnormal and inconclusive findings on diagnostic imaging of breast: Secondary | ICD-10-CM | POA: Diagnosis not present

## 2022-05-07 DIAGNOSIS — N6489 Other specified disorders of breast: Secondary | ICD-10-CM

## 2022-05-07 HISTORY — PX: BREAST BIOPSY: SHX20

## 2022-07-09 ENCOUNTER — Other Ambulatory Visit: Payer: Self-pay | Admitting: Obstetrics & Gynecology

## 2022-07-09 MED ORDER — ALPRAZOLAM 0.25 MG PO TABS: ORAL_TABLET | ORAL | 2 refills | Status: DC

## 2022-07-09 NOTE — Telephone Encounter (Signed)
Medication refill request: xanax Last AEX:  12/14/21 Next AEX: not scheduled  Last MMG (if hormonal medication request): n/a Refill authorized: #30 with 2 rf

## 2022-07-10 DIAGNOSIS — D1801 Hemangioma of skin and subcutaneous tissue: Secondary | ICD-10-CM | POA: Diagnosis not present

## 2022-07-10 DIAGNOSIS — C44519 Basal cell carcinoma of skin of other part of trunk: Secondary | ICD-10-CM | POA: Diagnosis not present

## 2022-07-10 DIAGNOSIS — L821 Other seborrheic keratosis: Secondary | ICD-10-CM | POA: Diagnosis not present

## 2022-07-10 DIAGNOSIS — D225 Melanocytic nevi of trunk: Secondary | ICD-10-CM | POA: Diagnosis not present

## 2022-07-10 DIAGNOSIS — Z85828 Personal history of other malignant neoplasm of skin: Secondary | ICD-10-CM | POA: Diagnosis not present

## 2022-09-20 ENCOUNTER — Other Ambulatory Visit: Payer: Self-pay | Admitting: Family Medicine

## 2022-09-20 DIAGNOSIS — N6011 Diffuse cystic mastopathy of right breast: Secondary | ICD-10-CM

## 2022-11-05 ENCOUNTER — Encounter: Payer: Self-pay | Admitting: Family Medicine

## 2022-11-07 ENCOUNTER — Ambulatory Visit
Admission: RE | Admit: 2022-11-07 | Discharge: 2022-11-07 | Disposition: A | Discharge status: Home / Self Care | Payer: 59 | Source: Ambulatory Visit | Attending: Family Medicine | Admitting: Family Medicine

## 2022-11-07 DIAGNOSIS — N6011 Diffuse cystic mastopathy of right breast: Secondary | ICD-10-CM

## 2022-11-07 DIAGNOSIS — N6081 Other benign mammary dysplasias of right breast: Secondary | ICD-10-CM | POA: Diagnosis not present

## 2022-11-12 DIAGNOSIS — L821 Other seborrheic keratosis: Secondary | ICD-10-CM | POA: Diagnosis not present

## 2022-11-12 DIAGNOSIS — C44619 Basal cell carcinoma of skin of left upper limb, including shoulder: Secondary | ICD-10-CM | POA: Diagnosis not present

## 2022-11-12 DIAGNOSIS — L910 Hypertrophic scar: Secondary | ICD-10-CM | POA: Diagnosis not present

## 2022-11-12 DIAGNOSIS — D1801 Hemangioma of skin and subcutaneous tissue: Secondary | ICD-10-CM | POA: Diagnosis not present

## 2022-11-12 DIAGNOSIS — D225 Melanocytic nevi of trunk: Secondary | ICD-10-CM | POA: Diagnosis not present

## 2022-11-12 DIAGNOSIS — D2272 Melanocytic nevi of left lower limb, including hip: Secondary | ICD-10-CM | POA: Diagnosis not present

## 2022-11-12 DIAGNOSIS — Z85828 Personal history of other malignant neoplasm of skin: Secondary | ICD-10-CM | POA: Diagnosis not present

## 2022-11-29 DIAGNOSIS — L089 Local infection of the skin and subcutaneous tissue, unspecified: Secondary | ICD-10-CM | POA: Diagnosis not present

## 2022-11-29 DIAGNOSIS — S61502A Unspecified open wound of left wrist, initial encounter: Secondary | ICD-10-CM | POA: Diagnosis not present

## 2022-11-29 DIAGNOSIS — X58XXXA Exposure to other specified factors, initial encounter: Secondary | ICD-10-CM | POA: Diagnosis not present

## 2022-12-17 ENCOUNTER — Encounter: Payer: Self-pay | Admitting: Obstetrics and Gynecology

## 2022-12-17 ENCOUNTER — Ambulatory Visit (INDEPENDENT_AMBULATORY_CARE_PROVIDER_SITE_OTHER): Payer: 59 | Admitting: Obstetrics and Gynecology

## 2022-12-17 VITALS — BP 110/76 | HR 88 | Ht 65.0 in | Wt 162.0 lb

## 2022-12-17 DIAGNOSIS — Z01419 Encounter for gynecological examination (general) (routine) without abnormal findings: Secondary | ICD-10-CM | POA: Insufficient documentation

## 2022-12-17 DIAGNOSIS — N951 Menopausal and female climacteric states: Secondary | ICD-10-CM | POA: Diagnosis not present

## 2022-12-17 DIAGNOSIS — Z23 Encounter for immunization: Secondary | ICD-10-CM | POA: Diagnosis not present

## 2022-12-17 DIAGNOSIS — F419 Anxiety disorder, unspecified: Secondary | ICD-10-CM | POA: Diagnosis not present

## 2022-12-17 MED ORDER — PROGESTERONE MICRONIZED 100 MG PO CAPS: 100.0000 mg | ORAL_CAPSULE | Freq: Every day | ORAL | 4 refills | Status: DC

## 2022-12-17 MED ORDER — ESTRADIOL 0.0375 MG/24HR TD PTTW: 1.0000 | MEDICATED_PATCH | TRANSDERMAL | 4 refills | Status: DC

## 2022-12-17 NOTE — Assessment & Plan Note (Signed)
 Cervical cancer screening performed according to ASCCP guidelines. Encouraged annual mammogram screening Colonoscopy UTD DXA N/A Labs and immunizations with her primary Encouraged safe sexual practices as indicated Encouraged healthy lifestyle practices with diet and exercise For patients under 50-57yo, I recommend 1200mg  calcium daily and 600IU of vitamin D daily.

## 2022-12-17 NOTE — Assessment & Plan Note (Signed)
Using for VMS and mood sx Will continue current dosing Plan to taper down next, patient in agreement

## 2022-12-17 NOTE — Patient Instructions (Signed)
For patients under 50-57yo, I recommend 1200mg  calcium daily and 600IU of vitamin D daily. For patients over 57yo, I recommend 1200mg  calcium daily and 800IU of vitamin D daily.  Health Maintenance, Female Adopting a healthy lifestyle and getting preventive care are important in promoting health and wellness. Ask your health care provider about: The right schedule for you to have regular tests and exams. Things you can do on your own to prevent diseases and keep yourself healthy. What should I know about diet, weight, and exercise? Eat a healthy diet  Eat a diet that includes plenty of vegetables, fruits, low-fat dairy products, and lean protein. Do not eat a lot of foods that are high in solid fats, added sugars, or sodium. Maintain a healthy weight Body mass index (BMI) is used to identify weight problems. It estimates body fat based on height and weight. Your health care provider can help determine your BMI and help you achieve or maintain a healthy weight. Get regular exercise Get regular exercise. This is one of the most important things you can do for your health. Most adults should: Exercise for at least 150 minutes each week. The exercise should increase your heart rate and make you sweat (moderate-intensity exercise). Do strengthening exercises at least twice a week. This is in addition to the moderate-intensity exercise. Spend less time sitting. Even light physical activity can be beneficial. Watch cholesterol and blood lipids Have your blood tested for lipids and cholesterol at 57 years of age, then have this test every 5 years. Have your cholesterol levels checked more often if: Your lipid or cholesterol levels are high. You are older than 57 years of age. You are at high risk for heart disease. What should I know about cancer screening? Depending on your health history and family history, you may need to have cancer screening at various ages. This may include screening  for: Breast cancer. Cervical cancer. Colorectal cancer. Skin cancer. Lung cancer. What should I know about heart disease, diabetes, and high blood pressure? Blood pressure and heart disease High blood pressure causes heart disease and increases the risk of stroke. This is more likely to develop in people who have high blood pressure readings or are overweight. Have your blood pressure checked: Every 3-5 years if you are 83-57 years of age. Every year if you are 4 years old or older. Diabetes Have regular diabetes screenings. This checks your fasting blood sugar level. Have the screening done: Once every three years after age 68 if you are at a normal weight and have a low risk for diabetes. More often and at a younger age if you are overweight or have a high risk for diabetes. What should I know about preventing infection? Hepatitis B If you have a higher risk for hepatitis B, you should be screened for this virus. Talk with your health care provider to find out if you are at risk for hepatitis B infection. Hepatitis C Testing is recommended for: Everyone born from [birth year] through 1965. Anyone with known risk factors for hepatitis C. Sexually transmitted infections (STIs) Get screened for STIs, including gonorrhea and chlamydia, if: You are sexually active and are younger than 57 years of age. You are older than 57 years of age and your health care provider tells you that you are at risk for this type of infection. Your sexual activity has changed since you were last screened, and you are at increased risk for chlamydia or gonorrhea. Ask your health care provider if  you are at risk. Ask your health care provider about whether you are at high risk for HIV. Your health care provider may recommend a prescription medicine to help prevent HIV infection. If you choose to take medicine to prevent HIV, you should first get tested for HIV. You should then be tested every 3 months for as long as you  are taking the medicine. Osteoporosis and menopause Osteoporosis is a disease in which the bones lose minerals and strength with aging. This can result in bone fractures. If you are 44 years old or older, or if you are at risk for osteoporosis and fractures, ask your health care provider if you should: Be screened for bone loss. Take a calcium or vitamin D supplement to lower your risk of fractures. Be given hormone replacement therapy (HRT) to treat symptoms of menopause. Follow these instructions at home: Alcohol use Do not drink alcohol if: Your health care provider tells you not to drink. You are pregnant, may be pregnant, or are planning to become pregnant. If you drink alcohol: Limit how much you have to: 0-1 drink a day. Know how much alcohol is in your drink. In the [place] one drink equals one 12 oz bottle of beer (355 mL), one 5 oz glass of wine (148 mL), or one 1 oz glass of hard liquor (44 mL). Lifestyle Do not use any products that contain nicotine or tobacco. These products include cigarettes, chewing tobacco, and vaping devices, such as e-cigarettes. If you need help quitting, ask your health care provider. Do not use street drugs. Do not share needles. Ask your health care provider for help if you need support or information about quitting drugs. General instructions Schedule regular health, dental, and eye exams. Stay current with your vaccines. Tell your health care provider if: You often feel depressed. You have ever been abused or do not feel safe at home. Summary Adopting a healthy lifestyle and getting preventive care are important in promoting health and wellness. Follow your health care provider's instructions about healthy diet, exercising, and getting tested or screened for diseases. Follow your health care provider's instructions on monitoring your cholesterol and blood pressure. This information is not intended to replace advice given to you by your health  care provider. Make sure you discuss any questions you have with your health care provider. Document Revised: 06/06/2020 Document Reviewed: 06/06/2020 Elsevier Patient Education  2024 [employer].

## 2022-12-17 NOTE — Progress Notes (Addendum)
57 y.o. [id number] postmenopausal female on HRT here for annual exam.  Married.  Vasectomy.   Pt would like to discuss HRT, and medications. She is wanting to re-evaluate what she is taking. She is fasting and would like to do blood work. Pt would like a flu shot.   Postmenopausal bleeding: none Pelvic discharge or pain: none Breast mass, nipple discharge or skin changes : none Last PAP:     Component Value Date/Time   DIAGPAP  12/14/2021 1629    - Negative for Intraepithelial Lesions or Malignancy (NILM)   DIAGPAP - Benign reactive/reparative changes 12/14/2021 1629   DIAGPAP (A) 12/08/2020 1506    - Atypical squamous cells of undetermined significance (ASC-US)   HPVHIGH Negative 12/08/2020 1506   ADEQPAP  12/14/2021 1629    Satisfactory for evaluation; transformation zone component PRESENT.   ADEQPAP  12/08/2020 1506    Satisfactory for evaluation; transformation zone component PRESENT.   Sexually active: Yes.    Exercising: Yes.    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  OB History  Gravida Para Term Preterm AB Living  4 2 1 1 2 2   SAB IAB Ectopic Multiple Live Births  1   1   2     # Outcome Date GA Lbr Len/2nd Weight Sex Type Anes PTL Lv  4 Ectopic           3 SAB           2 Preterm     M CS-Unspec  Y LIV  1 Term     M Vag-Spont  N LIV    Past Medical History:  Diagnosis Date   Anxiety    BCC (basal cell carcinoma of skin)    chest and back and right leg   CIN I (cervical intraepithelial neoplasia I)    Ectopic pregnancy    HISTORY OF ECTOPIC/METHOTREXATE   NSVD (normal spontaneous vaginal delivery)    macrosomia    Past Surgical History:  Procedure Laterality Date   ABLATION ON ENDOMETRIOSIS N/A 07/01/2013   Procedure: Fulguration of ENDOMETRIOSIS;  Surgeon: Ok Edwards, MD;  Location: [facility];  Service: Gynecology;   Laterality: N/A;   BREAST BIOPSY Right 05/07/2022   MM RT BREAST BX W LOC DEV 1ST LESION IMAGE BX SPEC STEREO GUIDE 05/07/2022 [facility]   CERVICAL BIOPSY  W/ LOOP ELECTRODE EXCISION     CESAREAN SECTION  2004   33 weeks-hyprops, fetal tachycardia   COLONOSCOPY     2016   DILATION AND CURETTAGE OF UTERUS  1997   miscarriage   LAPAROSCOPY Bilateral 07/01/2013   Procedure: LAPAROSCOPY OPERATIVE/BILATERAL OVARIAN CYSTECTOMY;  Surgeon: Ok Edwards, MD;  Location: [facility];  Service: Gynecology;  Laterality: Bilateral;  bilateral ovarian cystectomy     Current Outpatient Medications on File Prior to Visit  Medication Sig Dispense Refill   ALPRAZolam (XANAX) 0.25 MG tablet TAKE 1 TABLET BY MOUTH EVERY DAY AT BEDTIME AS NEEDED FOR SLEEP 30 tablet 2   fluvastatin XL (LESCOL XL) 80 MG 24 hr tablet TAKE 1 TABLET BY MOUTH FIVE TIMES A WEEK 60 tablet 3   No current facility-administered medications on file prior to visit.    Social History   Socioeconomic History   Marital status: Married    Spouse name: Not on file  Number of children: 2   Years of education: Not on file   Highest education level: Not on file  Occupational History   Occupation: Event organiser: [employer]  Tobacco Use   Smoking status: Never   Smokeless tobacco: Never  Vaping Use   Vaping status: Never Used  Substance and Sexual Activity   Alcohol use: Yes    Comment: socially   Drug use: No   Sexual activity: Yes    Partners: Male    Birth control/protection: Post-menopausal, Other-see comments    Comment: HUSBAND  VASECTOMY  Other Topics Concern   Not on file  Social History Narrative   Not on file   Social Determinants of Health   Financial Resource Strain: Not on file  Food Insecurity: Not on file  Transportation Needs: Not on file  Physical Activity: Not on file  Stress: Not on file  Social Connections: Not on file  Intimate Partner Violence: Not on file    Family History  Problem  Relation Age of Onset   Colon cancer Mother    Bladder Cancer Mother    Heart disease Mother        MI   Heart attack Mother    Prostate cancer Father    Bladder Cancer Maternal Grandmother    Diabetes Paternal Grandmother    Esophageal cancer Neg Hx    Rectal cancer Neg Hx    Stomach cancer Neg Hx     No Known Allergies    PE Today's Vitals   12/17/22 0759  BP: 110/76  Pulse: 88  SpO2: 98%  Weight: 162 lb (73.5 kg)  Height: 5\' 5"  (1.651 m)   Body mass index is 26.96 kg/m.  Physical Exam Vitals reviewed. Exam conducted with a chaperone present.  Constitutional:      General: She is not in acute distress.    Appearance: Normal appearance.  HENT:     Head: Normocephalic and atraumatic.     Nose: Nose normal.  Eyes:     Extraocular Movements: Extraocular movements intact.     Conjunctiva/sclera: Conjunctivae normal.  Neck:     Thyroid: No thyroid mass, thyromegaly or thyroid tenderness.  Pulmonary:     Effort: Pulmonary effort is normal.  Chest:     Chest wall: No mass or tenderness.  Breasts:    Right: Normal. No swelling, mass, nipple discharge, skin change or tenderness.     Left: Normal. No swelling, mass, nipple discharge, skin change or tenderness.  Abdominal:     General: There is no distension.     Palpations: Abdomen is soft.     Tenderness: There is no abdominal tenderness.  Genitourinary:    General: Normal vulva.     Exam position: Lithotomy position.     Urethra: No prolapse.     Vagina: Normal. No vaginal discharge or bleeding.     Cervix: Normal. No lesion.     Uterus: Normal. Not enlarged and not tender.      Adnexa: Right adnexa normal and left adnexa normal.  Musculoskeletal:        General: Normal range of motion.     Cervical back: Normal range of motion.  Lymphadenopathy:     Upper Body:     Right upper body: No axillary adenopathy.     Left upper body: No axillary adenopathy.     Lower Body: No right inguinal adenopathy. No left  inguinal adenopathy.  Skin:    General: Skin  is warm and dry.  Neurological:     General: No focal deficit present.     Mental Status: She is alert.  Psychiatric:        Mood and Affect: Mood normal.        Behavior: Behavior normal.       Assessment and Plan:        Well woman exam with routine gynecological exam Assessment & Plan: Cervical cancer screening performed according to ASCCP guidelines. Encouraged annual mammogram screening Colonoscopy UTD DXA N/A Labs and immunizations with her primary Encouraged safe sexual practices as indicated Encouraged healthy lifestyle practices with diet and exercise For patients under 50-70yo, I recommend 1200mg  calcium daily and 600IU of vitamin D daily.    Vasomotor symptoms due to menopause Assessment & Plan: Using for VMS and mood sx Will continue current dosing Plan to taper down next, patient in agreement  Orders: -     Progesterone; Take 1 capsule (100 mg total) by mouth at bedtime.  Dispense: 90 capsule; Refill: 4 -     Estradiol; Place 1 patch onto the skin 2 (two) times a week.  Dispense: 24 patch; Refill: 4  Anxiety Assessment & Plan: Currently using xanax PRN Discussed avoiding long term use of benzodiazepines. Recommend SSRI and/or CBT. SSRIs can have side effects such as hypotension, mood irritability, GI upset, weight gain, sexual dysfunction, and drowsiness. Suicidal ideation can happpen in a subset of patient, and we reviewed that the medication would need to be stopped for this reason. She wants to consider, will call if she makes a decision to start SSRI       Rosalyn Gess, MD

## 2022-12-17 NOTE — Assessment & Plan Note (Signed)
Currently using xanax PRN Discussed avoiding long term use of benzodiazepines. Recommend SSRI and/or CBT. SSRIs can have side effects such as hypotension, mood irritability, GI upset, weight gain, sexual dysfunction, and drowsiness. Suicidal ideation can happpen in a subset of patient, and we reviewed that the medication would need to be stopped for this reason. She wants to consider, will call if she makes a decision to start SSRI

## 2023-03-29 ENCOUNTER — Other Ambulatory Visit: Payer: Self-pay | Admitting: Obstetrics & Gynecology

## 2023-03-29 DIAGNOSIS — F419 Anxiety disorder, unspecified: Secondary | ICD-10-CM

## 2023-03-29 NOTE — Telephone Encounter (Signed)
 Med refill request: alprazolam 0.25 mg Last AEX: 12/17/22 Dr. Seymour Bars Next AEX: none scheduled Last MMG (if hormonal med) n/a Last filled 12/31/22 Refill authorized: alprazolam 0.25 mg #30 Sent to provider for approval or denial as appropriate.

## 2023-04-02 ENCOUNTER — Other Ambulatory Visit: Payer: Self-pay | Admitting: Family Medicine

## 2023-04-02 DIAGNOSIS — Z1231 Encounter for screening mammogram for malignant neoplasm of breast: Secondary | ICD-10-CM

## 2023-04-17 ENCOUNTER — Other Ambulatory Visit: Payer: Self-pay | Admitting: Family Medicine

## 2023-04-17 DIAGNOSIS — E782 Mixed hyperlipidemia: Secondary | ICD-10-CM

## 2023-04-24 ENCOUNTER — Ambulatory Visit
Admission: RE | Admit: 2023-04-24 | Discharge: 2023-04-24 | Disposition: A | Discharge status: Home / Self Care | Source: Ambulatory Visit | Attending: Family Medicine

## 2023-04-24 DIAGNOSIS — Z1231 Encounter for screening mammogram for malignant neoplasm of breast: Secondary | ICD-10-CM

## 2023-05-07 IMAGING — MG MM DIGITAL SCREENING BILAT W/ TOMO AND CAD
8 series · 8 of 24 positions shown · non-contrast
Comparison: Previous exam(s).

CLINICAL DATA: Screening.

EXAM:
DIGITAL SCREENING BILATERAL MAMMOGRAM WITH TOMOSYNTHESIS AND CAD
TECHNIQUE: Bilateral screening digital craniocaudal and mediolateral oblique
mammograms were obtained. Bilateral screening digital breast
tomosynthesis was performed. The images were evaluated with
computer-aided detection.

[L CC synth-2D]
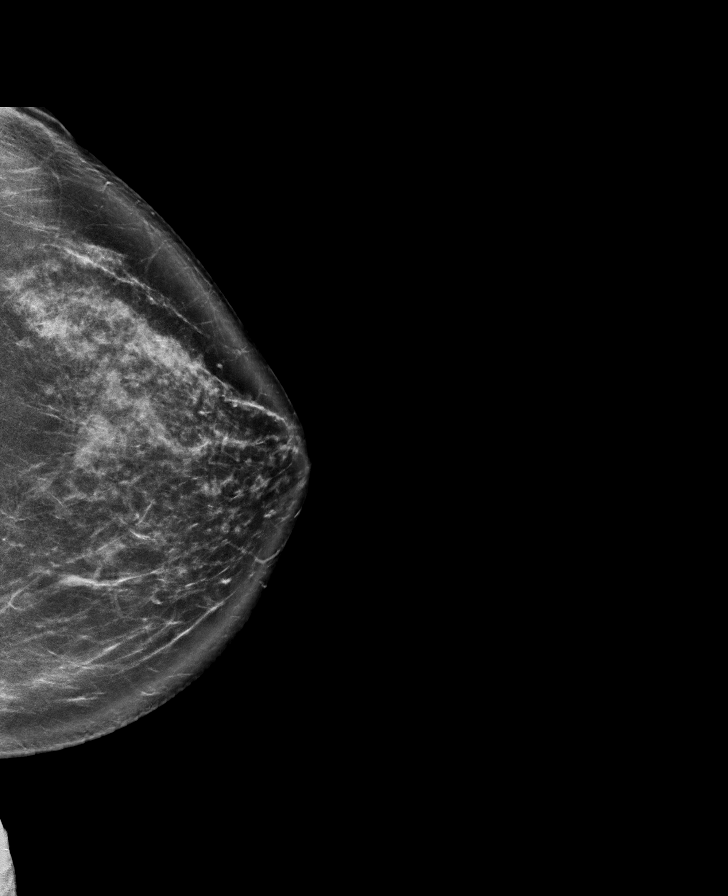

[L MLO synth-2D]
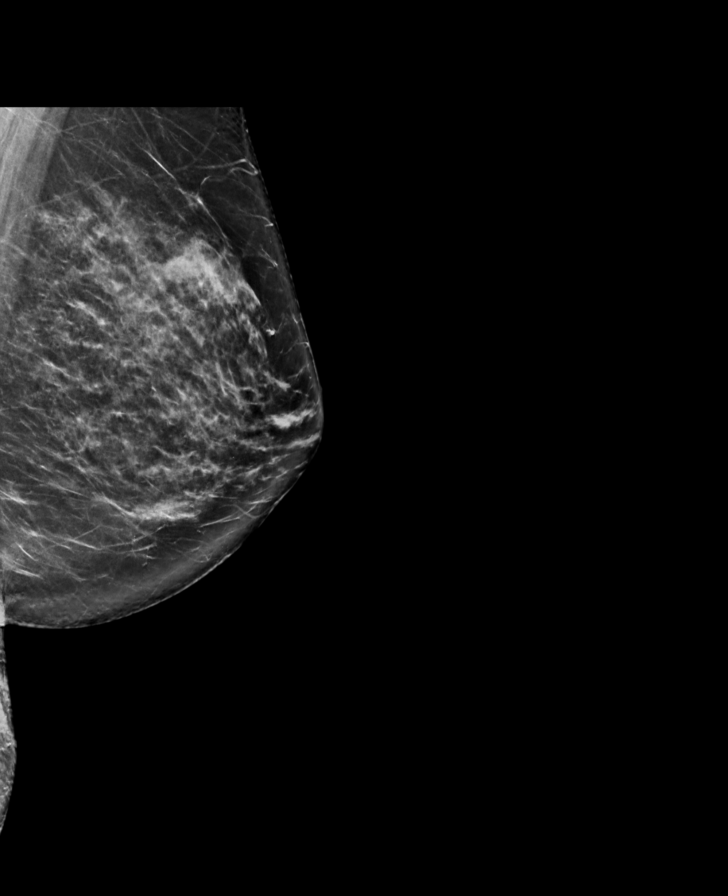

[R CC synth-2D]
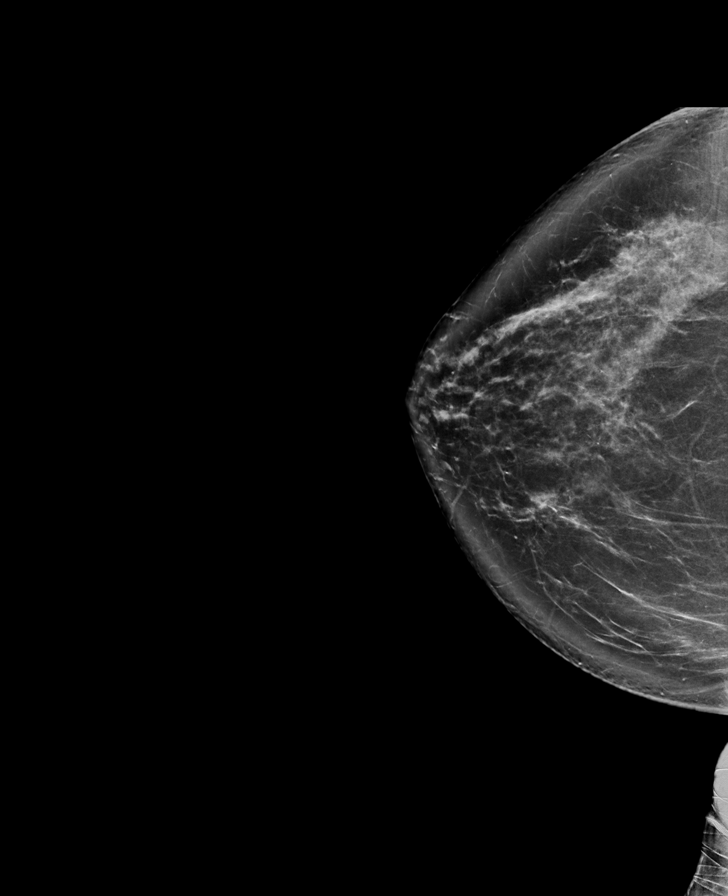

[R MLO synth-2D]
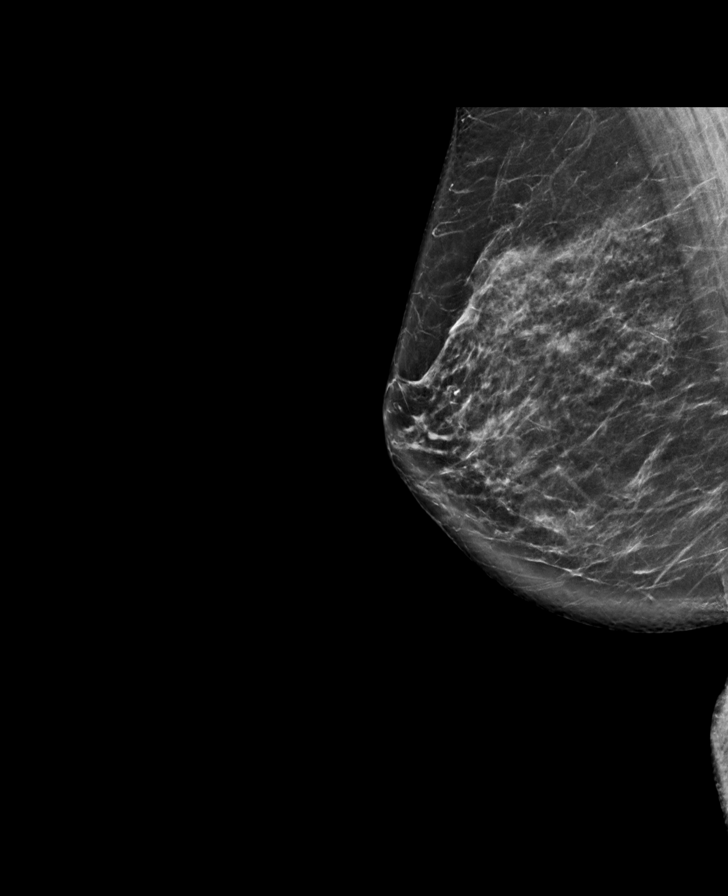

[L CC tomo · tomo slice 46/91.0]
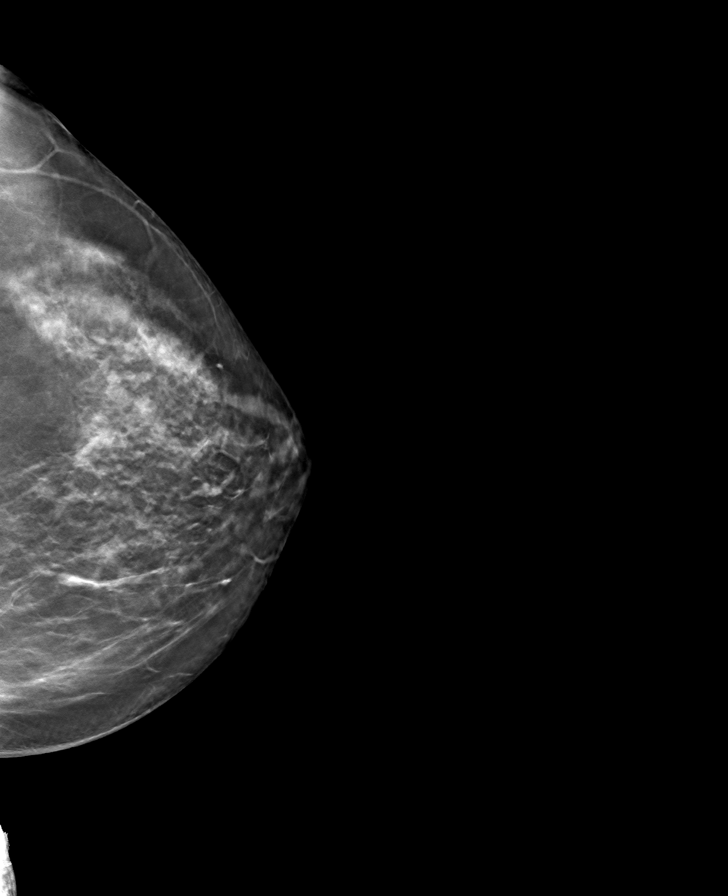

[R MLO tomo · tomo slice 46/91.0]
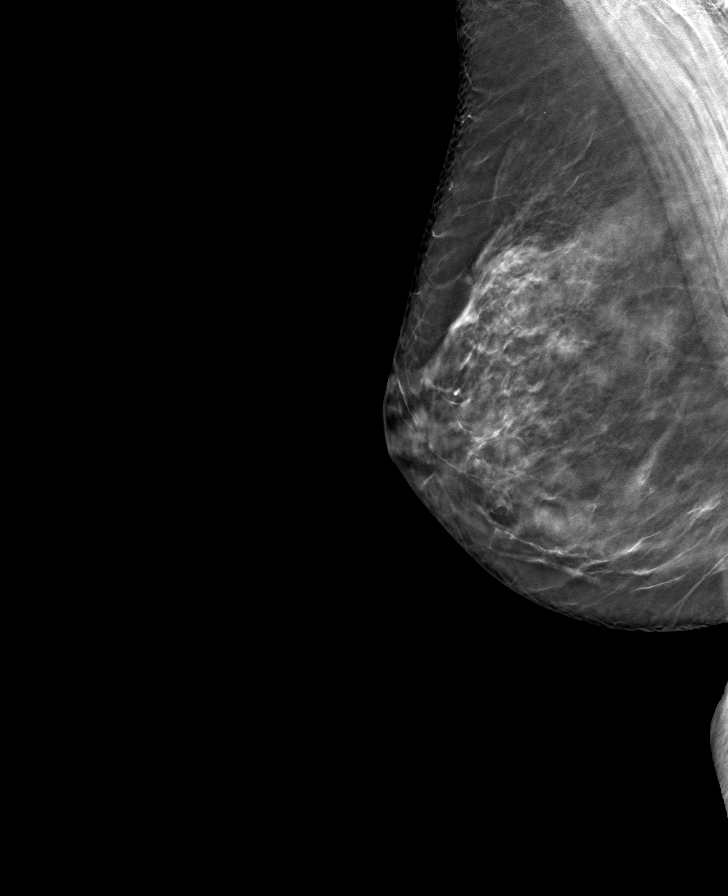

[L MLO tomo · tomo slice 45/89.0]
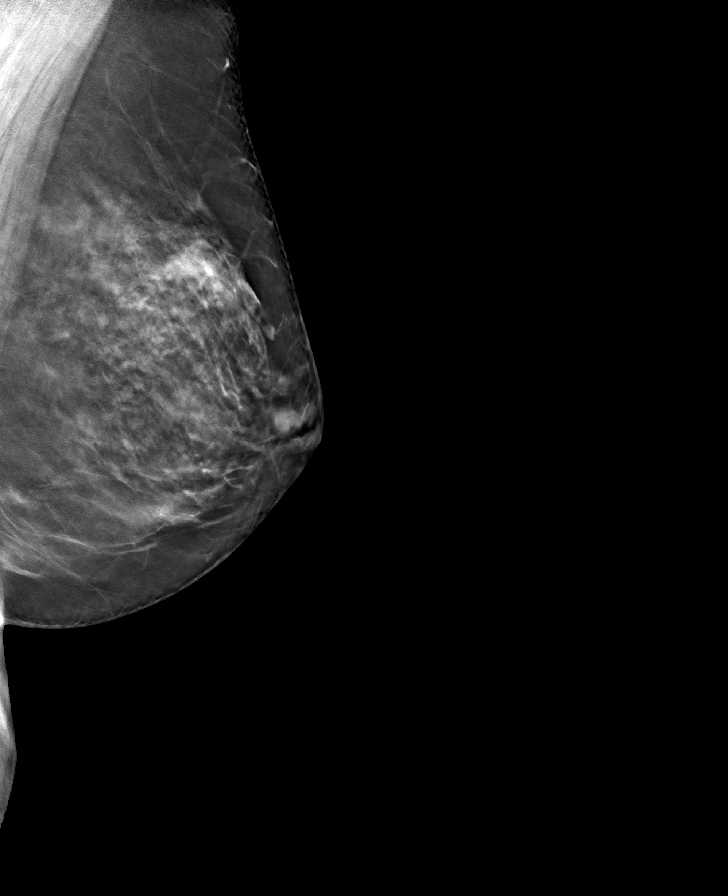

[R CC tomo · tomo slice 47/92.0]
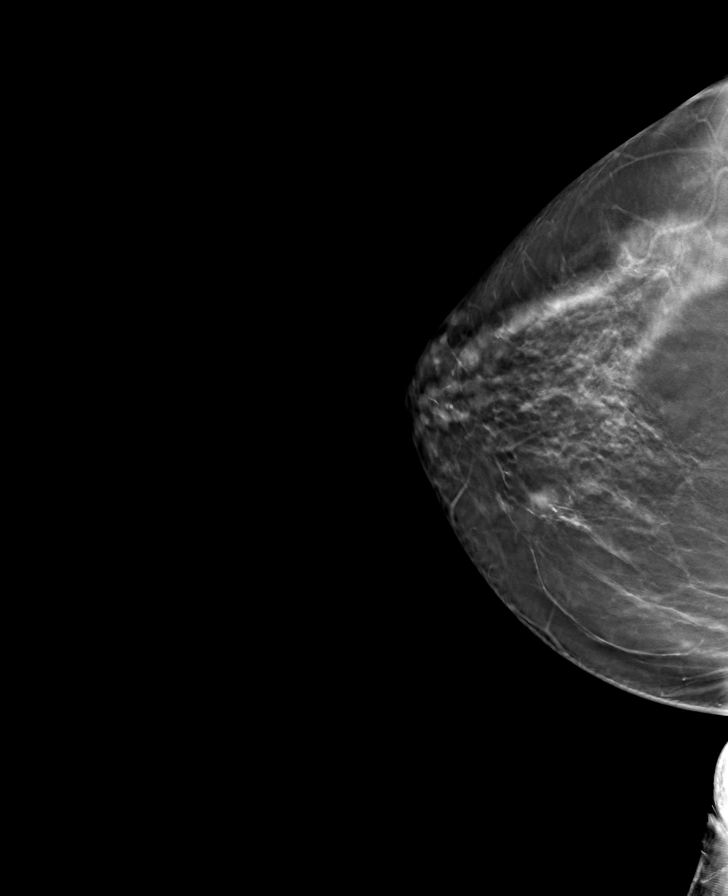

[8 of 24 positions shown; findings below may reference images not displayed]

ACR Breast Density Category c: The breast tissue is heterogeneously
dense, which may obscure small masses.
FINDINGS: In the left breast, a possible asymmetry warrants further
evaluation. In the right breast, no findings suspicious for
malignancy.
IMPRESSION: Further evaluation is suggested for possible asymmetry in the left
breast.

RECOMMENDATION:
Diagnostic mammogram and possibly ultrasound of the left breast.
(Code:8C-V-11X)

The patient will be contacted regarding the findings, and additional
imaging will be scheduled.

BI-RADS CATEGORY  0: Incomplete. Need additional imaging evaluation
and/or prior mammograms for comparison.

## 2023-05-13 DIAGNOSIS — L821 Other seborrheic keratosis: Secondary | ICD-10-CM | POA: Diagnosis not present

## 2023-05-13 DIAGNOSIS — Z85828 Personal history of other malignant neoplasm of skin: Secondary | ICD-10-CM | POA: Diagnosis not present

## 2023-05-13 DIAGNOSIS — C44519 Basal cell carcinoma of skin of other part of trunk: Secondary | ICD-10-CM | POA: Diagnosis not present

## 2023-05-13 DIAGNOSIS — D2261 Melanocytic nevi of right upper limb, including shoulder: Secondary | ICD-10-CM | POA: Diagnosis not present

## 2023-05-13 DIAGNOSIS — D1801 Hemangioma of skin and subcutaneous tissue: Secondary | ICD-10-CM | POA: Diagnosis not present

## 2023-05-13 DIAGNOSIS — L570 Actinic keratosis: Secondary | ICD-10-CM | POA: Diagnosis not present

## 2023-06-14 ENCOUNTER — Other Ambulatory Visit: Payer: Self-pay | Admitting: Radiology

## 2023-06-14 DIAGNOSIS — F419 Anxiety disorder, unspecified: Secondary | ICD-10-CM

## 2023-06-14 NOTE — Telephone Encounter (Signed)
 Med refill request: Xanax   Last AEX: 12/17/22 Next AEX: not scheduled  Last MMG (if hormonal med) 04/24/23 BIRADS Cat 1 neg  Refill authorized: last rx 03/29/23 #30 with 0 refills. Please approve or deny

## 2023-06-18 NOTE — Telephone Encounter (Signed)
 Discontinued at annual exam.

## 2023-07-04 ENCOUNTER — Other Ambulatory Visit: Payer: Self-pay | Admitting: Family Medicine

## 2023-07-04 DIAGNOSIS — E782 Mixed hyperlipidemia: Secondary | ICD-10-CM

## 2023-07-08 NOTE — Telephone Encounter (Signed)
 Called patient to sch Annual Physical, left a VM to return call

## 2023-08-08 ENCOUNTER — Encounter: Payer: Self-pay | Admitting: Family Medicine

## 2023-08-08 ENCOUNTER — Ambulatory Visit: Admitting: Family Medicine

## 2023-08-08 VITALS — BP 122/68 | HR 83 | Temp 98.0°F | Ht 65.0 in | Wt 162.0 lb

## 2023-08-08 DIAGNOSIS — E559 Vitamin D deficiency, unspecified: Secondary | ICD-10-CM

## 2023-08-08 DIAGNOSIS — Z0000 Encounter for general adult medical examination without abnormal findings: Secondary | ICD-10-CM | POA: Diagnosis not present

## 2023-08-08 DIAGNOSIS — E782 Mixed hyperlipidemia: Secondary | ICD-10-CM | POA: Diagnosis not present

## 2023-08-08 DIAGNOSIS — R208 Other disturbances of skin sensation: Secondary | ICD-10-CM | POA: Diagnosis not present

## 2023-08-08 DIAGNOSIS — Z23 Encounter for immunization: Secondary | ICD-10-CM | POA: Diagnosis not present

## 2023-08-08 DIAGNOSIS — R7303 Prediabetes: Secondary | ICD-10-CM

## 2023-08-08 DIAGNOSIS — Z1211 Encounter for screening for malignant neoplasm of colon: Secondary | ICD-10-CM

## 2023-08-08 LAB — LIPID PANEL
Cholesterol: 210 mg/dL — ABNORMAL HIGH (ref 0–200)
HDL: 54.7 mg/dL (ref >39.00)
LDL Cholesterol: 132 mg/dL — ABNORMAL HIGH (ref 0–99)
NonHDL: 155.74
Total CHOL/HDL Ratio: 4
Triglycerides: 119 mg/dL (ref 0.0–149.0)
VLDL: 23.8 mg/dL (ref 0.0–40.0)

## 2023-08-08 LAB — CBC WITH DIFFERENTIAL/PLATELET
Basophils Absolute: 0 K/uL (ref 0.0–0.1)
Basophils Relative: 0.3 % (ref 0.0–3.0)
Eosinophils Absolute: 0 K/uL (ref 0.0–0.7)
Eosinophils Relative: 0.5 % (ref 0.0–5.0)
HCT: 42.3 % (ref 36.0–46.0)
Hemoglobin: 14.1 g/dL (ref 12.0–15.0)
Lymphocytes Relative: 29 % (ref 12.0–46.0)
Lymphs Abs: 2.1 K/uL (ref 0.7–4.0)
MCHC: 33.3 g/dL (ref 30.0–36.0)
MCV: 90.4 fl (ref 78.0–100.0)
Monocytes Absolute: 0.6 K/uL (ref 0.1–1.0)
Monocytes Relative: 7.9 % (ref 3.0–12.0)
Neutro Abs: 4.5 K/uL (ref 1.4–7.7)
Neutrophils Relative %: 62.3 % (ref 43.0–77.0)
Platelets: 329 K/uL (ref 150.0–400.0)
RBC: 4.68 Mil/uL (ref 3.87–5.11)
RDW: 12.5 % (ref 11.5–15.5)
WBC: 7.3 K/uL (ref 4.0–10.5)

## 2023-08-08 LAB — COMPREHENSIVE METABOLIC PANEL WITH GFR
ALT: 17 U/L (ref 0–35)
AST: 14 U/L (ref 0–37)
Albumin: 4.6 g/dL (ref 3.5–5.2)
Alkaline Phosphatase: 74 U/L (ref 39–117)
BUN: 24 mg/dL — ABNORMAL HIGH (ref 6–23)
CO2: 27 meq/L (ref 19–32)
Calcium: 9 mg/dL (ref 8.4–10.5)
Chloride: 104 meq/L (ref 96–112)
Creatinine, Ser: 0.9 mg/dL (ref 0.40–1.20)
GFR: 70.62 mL/min (ref >60.00)
Glucose, Bld: 99 mg/dL (ref 70–99)
Potassium: 4.4 meq/L (ref 3.5–5.1)
Sodium: 139 meq/L (ref 135–145)
Total Bilirubin: 0.5 mg/dL (ref 0.2–1.2)
Total Protein: 7.4 g/dL (ref 6.0–8.3)

## 2023-08-08 LAB — VITAMIN B12: Vitamin B-12: 661 pg/mL (ref 211–911)

## 2023-08-08 LAB — MICROALBUMIN / CREATININE URINE RATIO
Creatinine,U: 93.8 mg/dL
Microalb Creat Ratio: 7.5 mg/g (ref 0.0–30.0)
Microalb, Ur: 0.7 mg/dL (ref 0.0–1.9)

## 2023-08-08 LAB — T4, FREE: Free T4: 0.67 ng/dL (ref 0.60–1.60)

## 2023-08-08 LAB — VITAMIN D 25 HYDROXY (VIT D DEFICIENCY, FRACTURES): VITD: 28.89 ng/mL — ABNORMAL LOW (ref 30.00–100.00)

## 2023-08-08 LAB — HEMOGLOBIN A1C: Hgb A1c MFr Bld: 6.1 % (ref 4.6–6.5)

## 2023-08-08 LAB — TSH: TSH: 1.01 u[IU]/mL (ref 0.35–5.50)

## 2023-08-08 NOTE — Progress Notes (Signed)
 Established Patient Office Visit   Subjective  Patient ID: Amanda Zuniga, female    DOB: 11-11-1965  Age: 58 y.o. MRN: [id number]  Chief Complaint  Patient presents with   Annual Exam    Weight loss Glp1 meds     Patient is a 58 year old female seen for CPE.  Patient states doing well overall.  Having difficulty losing weight.  Inquires about weight loss medication options.  Thinks insurance may cover cost as co-workers seem to be on the meds.  Staying around 160s lbs and would like to lose 15 pounds.  Exercising 3 times per week by walking and doing light weights.  Patient states she is an overall healthy eater.  Cut down on carbs.  Wonders if cholesterol will be high given family hx, mom with h/o MI.  Taking fluvastatin  XL 80 mg 5 times per week. Pt notes the occasional sensation of extremity feeling warm.    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   Perimenopause 09/09/2012   CIN I (cervical intraepithelial neoplasia I)    Ectopic pregnancy    H/O: vasectomy    Past Medical History:  Diagnosis Date   Anxiety    BCC (basal cell carcinoma of skin)    chest and back and right leg   CIN I (cervical intraepithelial neoplasia I)    Ectopic pregnancy    HISTORY OF ECTOPIC/METHOTREXATE   NSVD (normal spontaneous vaginal delivery)    macrosomia   Past Surgical History:  Procedure Laterality Date   ABLATION ON ENDOMETRIOSIS N/A 07/01/2013   Procedure: Fulguration of ENDOMETRIOSIS;  Surgeon: Curlee VEAR Guan, MD;  Location: [facility];  Service:  Gynecology;  Laterality: N/A;   BREAST BIOPSY Right 05/07/2022   MM RT BREAST BX W LOC DEV 1ST LESION IMAGE BX SPEC STEREO GUIDE 05/07/2022 [facility]   CERVICAL BIOPSY  W/ LOOP ELECTRODE EXCISION     CESAREAN SECTION  2004   33 weeks-hyprops, fetal tachycardia   COLONOSCOPY     2016   DILATION AND CURETTAGE OF UTERUS  1997   miscarriage   LAPAROSCOPY Bilateral 07/01/2013   Procedure: LAPAROSCOPY OPERATIVE/BILATERAL OVARIAN CYSTECTOMY;  Surgeon: Curlee VEAR Guan, MD;  Location: [facility];  Service: Gynecology;  Laterality: Bilateral;  bilateral ovarian cystectomy    Social History   Tobacco Use   Smoking status: Never   Smokeless tobacco: Never  Vaping Use   Vaping status: Never Used  Substance Use Topics   Alcohol use: Yes    Comment: socially   Drug use: No   Family History  Problem Relation Age of Onset   Colon cancer Mother    Bladder Cancer Mother    Heart disease Mother        MI   Heart attack Mother    Prostate cancer Father    Bladder Cancer Maternal Grandmother    Diabetes Paternal Grandmother    Esophageal cancer Neg Hx    Rectal cancer Neg Hx    Stomach cancer Neg Hx    BRCA 1/2 Neg Hx  Breast cancer Neg Hx    No Known Allergies  ROS Negative unless stated above    Objective:     BP 122/68 (BP Location: Left Arm, Patient Position: Sitting, Cuff Size: Normal)   Pulse 83   Temp 98 F (36.7 C) (Oral)   Ht 5' 5 (1.651 m)   Wt 162 lb (73.5 kg)   LMP 06/10/2016 (Approximate) Comment: husband vasectomy  SpO2 99%   BMI 26.96 kg/m  BP Readings from Last 3 Encounters:  08/08/23 122/68  12/17/22 110/76  03/26/22 120/60   Wt Readings from Last 3 Encounters:  08/08/23 162 lb (73.5 kg)  12/17/22 162 lb (73.5 kg)  03/26/22 164 lb (74.4 kg)      Physical Exam Constitutional:      Appearance: Normal appearance.  HENT:     Head: Normocephalic and atraumatic.     Right Ear: Tympanic membrane, ear canal and external ear normal.     Left Ear:  Tympanic membrane, ear canal and external ear normal.     Nose: Nose normal.     Mouth/Throat:     Mouth: Mucous membranes are moist.     Pharynx: No oropharyngeal exudate or posterior oropharyngeal erythema.  Eyes:     General: No scleral icterus.    Extraocular Movements: Extraocular movements intact.     Conjunctiva/sclera: Conjunctivae normal.     Pupils: Pupils are equal, round, and reactive to light.  Neck:     Thyroid: No thyromegaly.  Cardiovascular:     Rate and Rhythm: Normal rate and regular rhythm.     Pulses: Normal pulses.     Heart sounds: Normal heart sounds. No murmur heard.    No friction rub.  Pulmonary:     Effort: Pulmonary effort is normal.     Breath sounds: Normal breath sounds. No wheezing, rhonchi or rales.  Abdominal:     General: Bowel sounds are normal.     Palpations: Abdomen is soft.     Tenderness: There is no abdominal tenderness.  Musculoskeletal:        General: No deformity. Normal range of motion.  Lymphadenopathy:     Cervical: No cervical adenopathy.  Skin:    General: Skin is warm and dry.     Findings: No lesion.  Neurological:     General: No focal deficit present.     Mental Status: She is alert and oriented to person, place, and time.  Psychiatric:        Mood and Affect: Mood normal.        Thought Content: Thought content normal.        08/08/2023    8:27 AM 12/17/2022    9:50 AM 03/26/2022    8:38 AM  Depression screen PHQ 2/9  Decreased Interest 0 0 0  Down, Depressed, Hopeless 0 0 0  PHQ - 2 Score 0 0 0  Altered sleeping 0    Tired, decreased energy 0 0   Change in appetite 0 0   Feeling bad or failure about yourself  0 0   Trouble concentrating 0 0   Moving slowly or fidgety/restless 0 0   Suicidal thoughts 0 0   PHQ-9 Score 0    Difficult doing work/chores  Not difficult at all       08/08/2023    8:28 AM  GAD 7 : Generalized Anxiety Score  Nervous, Anxious, on Edge 0  Control/stop worrying 0  Worry too  much - different things 0  Trouble relaxing 0  Restless 0  Easily annoyed or irritable 0  Afraid - awful might happen 0  Total GAD 7 Score 0     No results found for any visits on 08/08/23.    Assessment & Plan:   Well adult exam -     CBC with Differential/Platelet; Future -     Comprehensive metabolic panel with GFR; Future -     Hemoglobin A1c; Future -     Lipid panel; Future  Vitamin D  deficiency -     VITAMIN D  25 Hydroxy (Vit-D Deficiency, Fractures); Future  Prediabetes -     Hemoglobin A1c; Future -     Microalbumin / creatinine urine ratio  Need for tetanus booster -     Tdap vaccine greater than or equal to 7yo IM  Mixed hyperlipidemia -     Comprehensive metabolic panel with GFR; Future -     Lipid panel; Future  Sensation of being warm -     CBC with Differential/Platelet; Future -     TSH; Future -     T4, free; Future -     Vitamin B12; Future  Colon cancer screening -     Ambulatory referral to Gastroenterology  Age-appropriate health screenings discussed.  Obtain labs.  Immunizations reviewed.  Tdap given this visit.  Repeat colonoscopy due 12/12/2023 as last done in 2015 with 10-year recall.  Order placed.  Mammogram done 04/24/2023.  Patient interested in to check with insurance company regarding coverage of weight loss medication.  At present patient is normal weight,  Body mass index is 26.96 kg/m.  Continue lifestyle modifications.   No follow-ups on file.   Clotilda JONELLE Single, MD

## 2023-08-15 ENCOUNTER — Telehealth: Payer: Self-pay | Admitting: *Deleted

## 2023-08-15 NOTE — Telephone Encounter (Signed)
 Copied from CRM [id number]. Topic: Clinical - Lab/Test Results >> Aug 15, 2023  9:26 AM Thersia BROCKS wrote: Reason for CRM: Patient wanted to speak with Dr.Banks about some medication changes , stated she sent over a message in [place] as well

## 2023-08-16 ENCOUNTER — Ambulatory Visit: Payer: Self-pay | Admitting: Family Medicine

## 2023-08-16 DIAGNOSIS — R635 Abnormal weight gain: Secondary | ICD-10-CM

## 2023-08-16 DIAGNOSIS — E559 Vitamin D deficiency, unspecified: Secondary | ICD-10-CM

## 2023-08-21 ENCOUNTER — Telehealth: Payer: Self-pay

## 2023-08-21 NOTE — Telephone Encounter (Signed)
 Copied from CRM [id number]. Topic: General - Other >> Aug 21, 2023  1:41 PM Thersia C wrote: Reason for CRM: Patient called in regarding sending a message to Dr.Banks and her team, Patient has about 3 medications that she needs to speak with her about . will like to be callback at [phone]

## 2023-08-23 MED ORDER — VITAMIN D (ERGOCALCIFEROL) 1.25 MG (50000 UNIT) PO CAPS: 50000.0000 [IU] | ORAL_CAPSULE | ORAL | 0 refills | Status: DC

## 2023-08-23 NOTE — Telephone Encounter (Signed)
 Spoke with patient, Vit D has been sent to pharmacy,

## 2023-08-26 ENCOUNTER — Other Ambulatory Visit: Payer: Self-pay | Admitting: Family Medicine

## 2023-08-26 DIAGNOSIS — E782 Mixed hyperlipidemia: Secondary | ICD-10-CM

## 2023-08-26 MED ORDER — EZETIMIBE 10 MG PO TABS
10.0000 mg | ORAL_TABLET | Freq: Every day | ORAL | 3 refills | Status: AC
Start: 2023-08-26 — End: ?

## 2023-08-26 NOTE — Telephone Encounter (Signed)
 A prescription for Zetia  10 mg was sent to your pharmacy.  You can take this along with your current statin to help lower your total and LDL cholesterol.  A referral can be placed to weight management for weight loss options.

## 2023-08-27 NOTE — Addendum Note (Signed)
 Addended by: BRIEN SONG A on: 08/27/2023 09:05 AM   Modules accepted: Orders

## 2023-09-19 DIAGNOSIS — M25561 Pain in right knee: Secondary | ICD-10-CM | POA: Diagnosis not present

## 2023-09-28 ENCOUNTER — Other Ambulatory Visit: Payer: Self-pay | Admitting: Family Medicine

## 2023-09-28 DIAGNOSIS — E782 Mixed hyperlipidemia: Secondary | ICD-10-CM

## 2023-10-04 ENCOUNTER — Encounter: Payer: Self-pay | Admitting: Family Medicine

## 2023-10-07 NOTE — Telephone Encounter (Signed)
 Spoke with patient per Dr. Mercer referral has been denied patient BMI is 26 and would need to be 30 for weight mang. Patient is aware Dr. Mercer is no longer mang GLP1's

## 2023-10-10 ENCOUNTER — Encounter: Payer: Self-pay | Admitting: Pediatrics

## 2023-10-25 ENCOUNTER — Other Ambulatory Visit: Payer: Self-pay | Admitting: Orthopedic Surgery

## 2023-10-25 DIAGNOSIS — M25561 Pain in right knee: Secondary | ICD-10-CM

## 2023-11-04 ENCOUNTER — Encounter: Payer: Self-pay | Admitting: Orthopedic Surgery

## 2023-11-06 NOTE — Progress Notes (Signed)
 Ailed Defibaugh                                          MRN: [id number]   11/06/2023   The [facility] Quality Team Specialist reviewed this patient medical record for the purposes of chart review for care gap closure. The following were reviewed: chart review for care gap closure-colorectal cancer screening.    [facility] Quality Team

## 2023-11-11 ENCOUNTER — Ambulatory Visit
Admission: RE | Admit: 2023-11-11 | Discharge: 2023-11-11 | Disposition: A | Discharge status: Home / Self Care | Source: Ambulatory Visit | Attending: Orthopedic Surgery | Admitting: Orthopedic Surgery

## 2023-11-11 DIAGNOSIS — M23221 Derangement of posterior horn of medial meniscus due to old tear or injury, right knee: Secondary | ICD-10-CM | POA: Diagnosis not present

## 2023-11-11 DIAGNOSIS — M25561 Pain in right knee: Secondary | ICD-10-CM | POA: Diagnosis not present

## 2023-11-18 DIAGNOSIS — L570 Actinic keratosis: Secondary | ICD-10-CM | POA: Diagnosis not present

## 2023-11-18 DIAGNOSIS — Z85828 Personal history of other malignant neoplasm of skin: Secondary | ICD-10-CM | POA: Diagnosis not present

## 2023-11-18 DIAGNOSIS — D1801 Hemangioma of skin and subcutaneous tissue: Secondary | ICD-10-CM | POA: Diagnosis not present

## 2023-11-18 DIAGNOSIS — L821 Other seborrheic keratosis: Secondary | ICD-10-CM | POA: Diagnosis not present

## 2023-11-18 DIAGNOSIS — D225 Melanocytic nevi of trunk: Secondary | ICD-10-CM | POA: Diagnosis not present

## 2023-11-22 ENCOUNTER — Encounter

## 2023-12-03 ENCOUNTER — Other Ambulatory Visit: Payer: Self-pay | Admitting: Orthopedic Surgery

## 2023-12-04 NOTE — Progress Notes (Signed)
 Sent message, via epic in basket, requesting orders in epic from Careers adviser.

## 2023-12-05 NOTE — Patient Instructions (Signed)
 SURGICAL WAITING ROOM VISITATION Patients having surgery or a procedure may have no more than 2 support people in the waiting area - these visitors may rotate in the visitor waiting room.   If the patient needs to stay at the hospital during part of their recovery, the visitor guidelines for inpatient rooms apply.  PRE-OP VISITATION  Pre-op nurse will coordinate an appropriate time for 1 support person to accompany the patient in pre-op.  This support person may not rotate.  This visitor will be contacted when the time is appropriate for the visitor to come back in the pre-op area.  Please refer to the [facility] website for the visitor guidelines for Inpatients (after your surgery is over and you are in a regular room).  You are not required to quarantine at this time prior to your surgery. However, you must do this: Hand Hygiene often Do NOT share personal items Notify your provider if you are in close contact with someone who has COVID or you develop fever 100.4 or greater, new onset of sneezing, cough, sore throat, shortness of breath or body aches.  If you test positive for Covid or have been in contact with anyone that has tested positive in the last 10 days please notify you surgeon.    Your procedure is scheduled on:  MONDAY  December 09, 2023  Report to [facility] Main Entrance: [address] entrance where the [employer] is available.   Report to admitting at:   12:00  Noon  Call this number if you have any questions or problems the morning of surgery [phone]  FOLLOW ANY ADDITIONAL PRE OP INSTRUCTIONS YOU RECEIVED FROM YOUR SURGEON'S OFFICE!!!  Do not eat food after Midnight the night prior to your surgery/procedure.  After Midnight you may have the following liquids until   11:15  AM DAY OF SURGERY  Clear Liquid Diet Water Black Coffee (sugar ok, NO MILK/CREAM OR CREAMERS)  Tea (sugar ok, NO MILK/CREAM OR CREAMERS) regular and decaf                              Plain Jell-O  with no fruit (NO RED)                                           Fruit ices (not with fruit pulp, NO RED)                                     Popsicles (NO RED)                                                                  Juice: NO CITRUS JUICES: only apple, WHITE grape, WHITE cranberry Sports drinks like Gatorade or Powerade (NO RED)                 Oral Hygiene is also important to reduce your risk of infection.        Remember - BRUSH YOUR TEETH THE MORNING OF SURGERY WITH YOUR REGULAR TOOTHPASTE  Do NOT smoke after Midnight the night before surgery.  STOP TAKING all Vitamins, Herbs and supplements 1 week before your surgery.   Take ONLY these medicines the morning of surgery with A SIP OF WATER: cetirizine   You may not have any metal on your body including hair pins, jewelry, and body piercing  Do not wear make-up, lotions, powders, perfumes or deodorant  Do not wear nail polish including gel and S&S, artificial / acrylic nails, or any other type of covering on natural nails including finger and toenails. If you have artificial nails, gel coating, etc., that needs to be removed by a nail salon, Please have this removed prior to surgery. Not doing so may mean that your surgery could be cancelled or delayed if the Surgeon or anesthesia staff feels like they are unable to monitor you safely.   Do not shave 48 hours prior to surgery to avoid nicks in your skin which may contribute to postoperative infections.    Contacts, Hearing Aids, dentures or bridgework may not be worn into surgery. DENTURES WILL BE REMOVED PRIOR TO SURGERY PLEASE DO NOT APPLY Poly grip OR ADHESIVES!!!  Patients discharged on the day of surgery will not be allowed to drive home.  Someone NEEDS to stay with you for the first 24 hours after anesthesia.  Do not bring your home medications to the hospital. The Pharmacy will dispense medications listed on your medication list to you during your  admission in the Hospital.  Special Instructions: Bring a copy of your healthcare power of attorney and living will documents the day of surgery, if you wish to have them scanned into your [place] [facility] Records- EPIC  Please read over the following fact sheets you were given: IF YOU HAVE QUESTIONS ABOUT YOUR PRE-OP INSTRUCTIONS, PLEASE CALL [phone]    [facility] - Preparing for Surgery        Before surgery, you can play an important role.  Because skin is not sterile, your skin needs to be as free of germs as possible.  You can reduce the number of germs on your skin by washing with CHG (chlorahexidine gluconate) soap before surgery.  CHG is an antiseptic cleaner which kills germs and bonds with the skin to continue killing germs even after washing. Please DO NOT use if you have an allergy to CHG or antibacterial soaps.  If your skin becomes reddened/irritated stop using the CHG and inform your nurse when you arrive at Short Stay. Do not shave (including legs and underarms) for at least 48 hours prior to the first CHG shower.  You may shave your face/neck.  Please follow these instructions carefully:  1.  Shower with CHG Soap the night before surgery ONLY (DO NOT USE THE CHG SOAP THE MORNING OF SURGERY).  2.  If you choose to wash your hair, wash your hair first as usual with your normal  shampoo.  3.  After you shampoo, rinse your hair and body thoroughly to remove the shampoo.                             4.  Use CHG as you would any other liquid soap.  You can apply chg directly to the skin and wash.  Gently with a scrungie or clean washcloth.  5.  Apply the CHG Soap to your body ONLY FROM THE NECK DOWN.   Do not use on face/ open  Wound or open sores. Avoid contact with eyes, ears mouth and genitals (private parts).                       Wash face,  Genitals (private parts) with your normal soap.             6.  Wash thoroughly, paying special attention to  the area where your  surgery  will be performed.  7.  Thoroughly rinse your body with warm water from the neck down.  8.  DO NOT shower/wash with your normal soap after using and rinsing off the CHG Soap.                9.  Pat yourself dry with a clean towel.            10.  Wear clean pajamas.            11.  Place clean sheets on your bed the night of your first shower and do not  sleep with pets.  Day of Surgery : Do not apply any CHG, lotions/deodorants the morning of surgery.  Please wear clean clothes to the hospital/surgery center.   FAILURE TO FOLLOW THESE INSTRUCTIONS MAY RESULT IN THE CANCELLATION OF YOUR SURGERY  PATIENT SIGNATURE_________________________________  NURSE SIGNATURE__________________________________  ________________________________________________________________________

## 2023-12-05 NOTE — Progress Notes (Signed)
 COVID Vaccine received:  []  No [x]  Yes Date of any COVID positive Test in last 90 days:  PCP - Clotilda Single, MD  Cardiologist -   Chest x-ray -  EKG -   day of PST Stress Test -  ECHO -  Cardiac Cath -  CT Coronary Calcium score:   Pacemaker / ICD device [x]  No []  Yes   Spinal Cord Stimulator:[x]  No []  Yes       History of Sleep Apnea? [x]  No []  Yes   CPAP used?- [x]  No []  Yes    Patient has: []  NO Hx DM   [x]  Pre-DM   []  DM1  []   DM2 Does the patient monitor blood sugar?   []  N/A   [x]  No []  Yes  Last A1c was: 6.1 on 08-08-23      Blood Thinner / Instructions: none Aspirin Instructions:  none  Comments:   Activity level: Able to walk up 2 flights of stairs without becoming significantly short of breath or having chest pain?  []  No   []    Yes  Patient can perform ADLs without assistance. []  No   []   Yes  Anesthesia review: Pre-DM no meds, anxiety  Patient denies any S&S of respiratory illness or Covid - no shortness of breath, fever, cough or chest pain at PAT appointment.  Patient verbalized understanding and agreement to the Pre-Surgical Instructions that were given to them at this PAT appointment. Patient was also educated of the need to review these PAT instructions again prior to her surgery.I reviewed the appropriate phone numbers to call if they have any and questions or concerns.

## 2023-12-06 ENCOUNTER — Other Ambulatory Visit: Payer: Self-pay

## 2023-12-06 ENCOUNTER — Encounter: Admitting: Pediatrics

## 2023-12-06 ENCOUNTER — Encounter (HOSPITAL_COMMUNITY)
Admission: RE | Admit: 2023-12-06 | Discharge: 2023-12-06 | Disposition: A | Discharge status: Home / Self Care | Source: Ambulatory Visit | Attending: Orthopedic Surgery | Admitting: Orthopedic Surgery

## 2023-12-06 ENCOUNTER — Encounter (HOSPITAL_COMMUNITY): Payer: Self-pay

## 2023-12-06 VITALS — BP 112/75 | HR 90 | Temp 98.5°F | Resp 16 | Ht 65.0 in | Wt 160.0 lb

## 2023-12-06 DIAGNOSIS — R7303 Prediabetes: Secondary | ICD-10-CM | POA: Diagnosis not present

## 2023-12-06 DIAGNOSIS — Z01818 Encounter for other preprocedural examination: Secondary | ICD-10-CM | POA: Diagnosis not present

## 2023-12-06 HISTORY — DX: Gastro-esophageal reflux disease without esophagitis: K21.9

## 2023-12-06 HISTORY — DX: Prediabetes: R73.03

## 2023-12-06 LAB — BASIC METABOLIC PANEL WITH GFR
Anion gap: 10 (ref 5–15)
BUN: 22 mg/dL — ABNORMAL HIGH (ref 6–20)
CO2: 24 mmol/L (ref 22–32)
Calcium: 9.2 mg/dL (ref 8.9–10.3)
Chloride: 107 mmol/L (ref 98–111)
Creatinine, Ser: 0.78 mg/dL (ref 0.44–1.00)
GFR, Estimated: >60 mL/min (ref >60)
Glucose, Bld: 107 mg/dL — ABNORMAL HIGH (ref 70–99)
Potassium: 4.3 mmol/L (ref 3.5–5.1)
Sodium: 140 mmol/L (ref 135–145)

## 2023-12-06 LAB — CBC
HCT: 43.8 % (ref 36.0–46.0)
Hemoglobin: 14 g/dL (ref 12.0–15.0)
MCH: 29.9 pg (ref 26.0–34.0)
MCHC: 32 g/dL (ref 30.0–36.0)
MCV: 93.4 fL (ref 80.0–100.0)
Platelets: 336 K/uL (ref 150–400)
RBC: 4.69 MIL/uL (ref 3.87–5.11)
RDW: 11.9 % (ref 11.5–15.5)
WBC: 7.1 K/uL (ref 4.0–10.5)
nRBC: 0 % (ref 0.0–0.2)

## 2023-12-09 ENCOUNTER — Ambulatory Visit (HOSPITAL_COMMUNITY)
Admission: RE | Admit: 2023-12-09 | Discharge: 2023-12-09 | Disposition: A | Discharge status: Home / Self Care | Attending: Orthopedic Surgery | Admitting: Orthopedic Surgery

## 2023-12-09 ENCOUNTER — Ambulatory Visit (HOSPITAL_BASED_OUTPATIENT_CLINIC_OR_DEPARTMENT_OTHER): Admitting: Anesthesiology

## 2023-12-09 ENCOUNTER — Encounter (HOSPITAL_COMMUNITY)
Admission: RE | Disposition: A | Discharge status: Home / Self Care | Payer: Self-pay | Source: Home / Self Care | Attending: Orthopedic Surgery

## 2023-12-09 ENCOUNTER — Ambulatory Visit (HOSPITAL_COMMUNITY): Payer: Self-pay | Admitting: Physician Assistant

## 2023-12-09 ENCOUNTER — Encounter (HOSPITAL_COMMUNITY): Payer: Self-pay | Admitting: Orthopedic Surgery

## 2023-12-09 DIAGNOSIS — X58XXXA Exposure to other specified factors, initial encounter: Secondary | ICD-10-CM | POA: Diagnosis not present

## 2023-12-09 DIAGNOSIS — M94261 Chondromalacia, right knee: Secondary | ICD-10-CM | POA: Diagnosis not present

## 2023-12-09 DIAGNOSIS — S83241A Other tear of medial meniscus, current injury, right knee, initial encounter: Secondary | ICD-10-CM

## 2023-12-09 DIAGNOSIS — K219 Gastro-esophageal reflux disease without esophagitis: Secondary | ICD-10-CM | POA: Diagnosis not present

## 2023-12-09 HISTORY — PX: KNEE ARTHROSCOPY WITH MEDIAL MENISECTOMY: SHX5651

## 2023-12-09 SURGERY — ARTHROSCOPY, KNEE, WITH MEDIAL MENISCECTOMY
Anesthesia: General | Site: Knee | Laterality: Right

## 2023-12-09 MED ORDER — ONDANSETRON HCL 4 MG/2ML IJ SOLN
INTRAMUSCULAR | Status: DC | PRN
  Administered 2023-12-09: 4 mg via INTRAVENOUS

## 2023-12-09 MED ORDER — HYDROCODONE-ACETAMINOPHEN 5-325 MG PO TABS: 1.0000 | ORAL_TABLET | Freq: Four times a day (QID) | ORAL | 0 refills | Status: DC | PRN

## 2023-12-09 MED ORDER — AMISULPRIDE (ANTIEMETIC) 5 MG/2ML IV SOLN: 10.0000 mg | Freq: Once | INTRAVENOUS | Status: DC | PRN

## 2023-12-09 MED ORDER — EPINEPHRINE PF 1 MG/ML IJ SOLN
INTRAMUSCULAR | Status: AC
  Filled 2023-12-09: qty 1

## 2023-12-09 MED ORDER — PROPOFOL 10 MG/ML IV BOLUS
INTRAVENOUS | Status: AC
  Filled 2023-12-09: qty 20

## 2023-12-09 MED ORDER — FENTANYL CITRATE (PF) 100 MCG/2ML IJ SOLN
INTRAMUSCULAR | Status: AC
  Filled 2023-12-09: qty 2

## 2023-12-09 MED ORDER — ORAL CARE MOUTH RINSE: 15.0000 mL | Freq: Once | OROMUCOSAL | Status: AC

## 2023-12-09 MED ORDER — ACETAMINOPHEN 10 MG/ML IV SOLN
INTRAVENOUS | Status: AC
  Filled 2023-12-09: qty 100

## 2023-12-09 MED ORDER — FENTANYL CITRATE (PF) 50 MCG/ML IJ SOSY
25.0000 ug | PREFILLED_SYRINGE | INTRAMUSCULAR | Status: DC | PRN
  Administered 2023-12-09 (×3): 50 ug via INTRAVENOUS

## 2023-12-09 MED ORDER — DEXAMETHASONE SOD PHOSPHATE PF 10 MG/ML IJ SOLN
INTRAMUSCULAR | Status: DC | PRN
  Administered 2023-12-09: 10 mg via INTRAVENOUS

## 2023-12-09 MED ORDER — OXYCODONE HCL 5 MG PO TABS
ORAL_TABLET | ORAL | Status: AC
  Filled 2023-12-09: qty 1

## 2023-12-09 MED ORDER — CHLORHEXIDINE GLUCONATE 0.12 % MT SOLN
15.0000 mL | Freq: Once | OROMUCOSAL | Status: AC
  Administered 2023-12-09: 15 mL via OROMUCOSAL

## 2023-12-09 MED ORDER — FENTANYL CITRATE (PF) 50 MCG/ML IJ SOSY
PREFILLED_SYRINGE | INTRAMUSCULAR | Status: AC
  Filled 2023-12-09: qty 1

## 2023-12-09 MED ORDER — FENTANYL CITRATE (PF) 50 MCG/ML IJ SOSY
PREFILLED_SYRINGE | INTRAMUSCULAR | Status: AC
  Filled 2023-12-09: qty 2

## 2023-12-09 MED ORDER — BUPIVACAINE HCL (PF) 0.25 % IJ SOLN
INTRAMUSCULAR | Status: AC
Start: 2023-12-09 — End: 2023-12-09
  Filled 2023-12-09: qty 30

## 2023-12-09 MED ORDER — ACETAMINOPHEN 10 MG/ML IV SOLN
1000.0000 mg | Freq: Once | INTRAVENOUS | Status: DC | PRN
  Administered 2023-12-09: 1000 mg via INTRAVENOUS

## 2023-12-09 MED ORDER — CEFAZOLIN SODIUM-DEXTROSE 2-4 GM/100ML-% IV SOLN
2.0000 g | INTRAVENOUS | Status: AC
  Administered 2023-12-09: 2 g via INTRAVENOUS
  Filled 2023-12-09: qty 100

## 2023-12-09 MED ORDER — OXYCODONE HCL 5 MG PO TABS
5.0000 mg | ORAL_TABLET | Freq: Once | ORAL | Status: AC | PRN
  Administered 2023-12-09: 5 mg via ORAL

## 2023-12-09 MED ORDER — BUPIVACAINE HCL (PF) 0.25 % IJ SOLN
INTRAMUSCULAR | Status: DC | PRN
  Administered 2023-12-09: 20 mL

## 2023-12-09 MED ORDER — MIDAZOLAM HCL 5 MG/5ML IJ SOLN
INTRAMUSCULAR | Status: DC | PRN
  Administered 2023-12-09: 2 mg via INTRAVENOUS

## 2023-12-09 MED ORDER — SODIUM CHLORIDE 0.9 % IR SOLN: Status: DC | PRN

## 2023-12-09 MED ORDER — LACTATED RINGERS IV SOLN: INTRAVENOUS | Status: DC

## 2023-12-09 MED ORDER — ONDANSETRON HCL 4 MG/2ML IJ SOLN: 4.0000 mg | Freq: Once | INTRAMUSCULAR | Status: DC | PRN

## 2023-12-09 MED ORDER — OXYCODONE HCL 5 MG/5ML PO SOLN: 5.0000 mg | Freq: Once | ORAL | Status: AC | PRN

## 2023-12-09 MED ORDER — MIDAZOLAM HCL (PF) 2 MG/2ML IJ SOLN
1.0000 mg | Freq: Once | INTRAMUSCULAR | Status: DC
Start: 2023-12-09 — End: 2023-12-09

## 2023-12-09 MED ORDER — MIDAZOLAM HCL 2 MG/2ML IJ SOLN
INTRAMUSCULAR | Status: AC
  Filled 2023-12-09: qty 2

## 2023-12-09 MED ORDER — LIDOCAINE HCL (CARDIAC) PF 100 MG/5ML IV SOSY
PREFILLED_SYRINGE | INTRAVENOUS | Status: DC | PRN
  Administered 2023-12-09: 50 mg via INTRAVENOUS

## 2023-12-09 MED ORDER — PHENYLEPHRINE HCL (PRESSORS) 10 MG/ML IV SOLN
INTRAVENOUS | Status: DC | PRN
  Administered 2023-12-09 (×2): 100 ug via INTRAVENOUS

## 2023-12-09 MED ORDER — FENTANYL CITRATE (PF) 50 MCG/ML IJ SOSY: 50.0000 ug | PREFILLED_SYRINGE | Freq: Once | INTRAMUSCULAR | Status: DC

## 2023-12-09 MED ORDER — FENTANYL CITRATE (PF) 100 MCG/2ML IJ SOLN
INTRAMUSCULAR | Status: DC | PRN
  Administered 2023-12-09: 100 ug via INTRAVENOUS

## 2023-12-09 MED ORDER — PROPOFOL 10 MG/ML IV BOLUS
INTRAVENOUS | Status: DC | PRN
  Administered 2023-12-09: 160 mg via INTRAVENOUS

## 2023-12-09 SURGICAL SUPPLY — 27 items
BLADE EXCALIBUR 4.0X13 (MISCELLANEOUS) ×1 IMPLANT
BNDG CONFORM 6X.82 1P STRL (GAUZE/BANDAGES/DRESSINGS) IMPLANT
BNDG ELASTIC 6INX 5YD STR LF (GAUZE/BANDAGES/DRESSINGS) ×1 IMPLANT
BNDG ELASTIC 6X10 VLCR STRL LF (GAUZE/BANDAGES/DRESSINGS) IMPLANT
DISSECTOR 4.0MM X 13CM (MISCELLANEOUS) IMPLANT
DRAPE ARTHROSCOPY W/POUCH 114 (DRAPES) ×1 IMPLANT
DRSG EMULSION OIL 3X3 NADH (GAUZE/BANDAGES/DRESSINGS) ×1 IMPLANT
DURAPREP 26ML APPLICATOR (WOUND CARE) ×1 IMPLANT
ELECT REM PT RETURN 15FT ADLT (MISCELLANEOUS) IMPLANT
GAUZE SPONGE 4X4 12PLY STRL (GAUZE/BANDAGES/DRESSINGS) ×1 IMPLANT
GLOVE BIOGEL PI IND STRL 8 (GLOVE) ×2 IMPLANT
GLOVE ECLIPSE 7.5 STRL STRAW (GLOVE) ×2 IMPLANT
GOWN STRL REUS W/ TWL XL LVL3 (GOWN DISPOSABLE) ×2 IMPLANT
HOLDER KNEE FOAM BLUE (MISCELLANEOUS) ×1 IMPLANT
IV NS IRRIG 3000ML ARTHROMATIC (IV SOLUTION) ×2 IMPLANT
KIT BASIN OR (CUSTOM PROCEDURE TRAY) ×1 IMPLANT
MANIFOLD NEPTUNE II (INSTRUMENTS) IMPLANT
NDL SAFETY ECLIPSE 18X1.5 (NEEDLE) IMPLANT
PACK ARTHROSCOPY WL (CUSTOM PROCEDURE TRAY) ×1 IMPLANT
PAD CAST 4YDX4 CTTN HI CHSV (CAST SUPPLIES) ×1 IMPLANT
PENCIL SMOKE EVACUATOR (MISCELLANEOUS) IMPLANT
SUT ETHILON 4 0 PS 2 18 (SUTURE) IMPLANT
SYR 10ML LL (SYRINGE) ×1 IMPLANT
TOWEL GREEN STERILE FF (TOWEL DISPOSABLE) ×1 IMPLANT
TUBING ARTHROSCOPY IRRIG 16FT (MISCELLANEOUS) ×1 IMPLANT
WATER STERILE IRR 1000ML POUR (IV SOLUTION) ×1 IMPLANT
WRAP KNEE MAXI GEL POST OP (GAUZE/BANDAGES/DRESSINGS) ×1 IMPLANT

## 2023-12-09 NOTE — Interval H&P Note (Signed)
 History and Physical Interval Note:  12/09/2023 12:37 PM  Amanda Zuniga  has presented today for surgery, with the diagnosis of RIGHT KNEE MEDIAL MENISCUS TEAR.  The various methods of treatment have been discussed with the patient and family. After consideration of risks, benefits and other options for treatment, the patient has consented to  Procedure(s): ARTHROSCOPY, KNEE, WITH MEDIAL MENISCECTOMY (Right) as a surgical intervention.  The patient's history has been reviewed, patient examined, no change in status, stable for surgery.  I have reviewed the patient's chart and labs.  Questions were answered to the patient's satisfaction.     [name]

## 2023-12-09 NOTE — Anesthesia Procedure Notes (Signed)
 Procedure Name: LMA Insertion Date/Time: 12/09/2023 3:13 PM  Performed by: Levander Lani BIRCH, CRNAPre-anesthesia Checklist: Patient identified, Emergency Drugs available, Suction available, Patient being monitored and Timeout performed Patient Re-evaluated:Patient Re-evaluated prior to induction Preoxygenation: Pre-oxygenation with 100% oxygen Induction Type: IV induction Ventilation: Mask ventilation without difficulty LMA: LMA inserted LMA Size: 3.0 Tube type: Oral Number of attempts: 1 Placement Confirmation: positive ETCO2 and breath sounds checked- equal and bilateral Secured at: 22 cm Tube secured with: Tape Dental Injury: Teeth and Oropharynx as per pre-operative assessment

## 2023-12-09 NOTE — Discharge Instructions (Signed)
 POST-OP KNEE ARTHROSCOPY INSTRUCTIONS  Dr. Cherlyn Roberts PA-C  Pain You will be expected to have a moderate amount of pain in the affected knee for approximately two weeks. However, the first two days will be the most severe pain. A prescription has been provided to take as needed for the pain. The pain can be reduced by applying ice packs to the knee for the first 1-2 weeks post surgery. Also, keeping the leg elevated on pillows will help alleviate the pain. If you develop any acute pain or swelling in your calf muscle, please call the doctor.  Activity It is preferred that you stay at bed rest for approximately 24 hours. However, you may go to the bathroom with help. Weight bearing as tolerated. You may begin the knee exercises the day of surgery. Discontinue crutches as the knee pain resolves.  Dressing Keep the dressing dry. If the ace bandage should wrinkle or roll up, this can be rewrapped to prevent ridges in the bandage. You may remove all dressings in 48 hours,  apply bandaids to each wound. You may shower on the 4th day after surgery but no tub bath.  Symptoms to report to your doctor Extreme pain Extreme swelling Temperature above 101 degrees Change in the feeling, color, or movement of your toes Redness, heat, or swelling at your incision  Exercise If is preferred that as soon as possible you try to do a straight leg raise without bending the knee and concentrate on bringing the heel of your foot off the bed up to approximately 45 degrees and hold for the count of 10 seconds. Repeat this at least 10 times three or four times per day. Additional exercises are provided below.  You are encouraged to bend the knee as tolerated.  Follow-Up Call to schedule a follow-up appointment in 5-7 days. Our office # is [phone].  POST-OP EXERCISES  Short Arc Quads  1. Lie on back with legs straight. Place towel roll under thigh, just above knee. 2. Tighten thigh muscles to  straighten knee and lift heel off bed. 3. Hold for slow count of five, then lower. 4. Do three sets of ten    Straight Leg Raises  1. Lie on back with operative leg straight and other leg bent. 2. Keeping operative leg completely straight, slowly lift operative leg so foot is 5 inches off bed. 3. Hold for slow count of five, then lower. 4. Do three sets of ten.    DO BOTH EXERCISES 2 TIMES A DAY  Ankle Pumps  Work/move the operative ankle and foot up and down 10 times every hour while awake.

## 2023-12-09 NOTE — Anesthesia Preprocedure Evaluation (Addendum)
 Anesthesia Evaluation  Patient identified by MRN, date of birth, ID band Patient awake    Reviewed: Allergy & Precautions, NPO status , Patient's Chart, lab work & pertinent test results  History of Anesthesia Complications Negative for: history of anesthetic complications  Airway Mallampati: I  TM Distance: >3 FB Neck ROM: Full    Dental  (+) Teeth Intact, Dental Advisory Given   Pulmonary    breath sounds clear to auscultation       Cardiovascular negative cardio ROS  Rhythm:Regular Rate:Normal     Neuro/Psych   Anxiety     negative neurological ROS     GI/Hepatic ,GERD  ,,  Endo/Other  neg diabetes    Renal/GU Renal disease     Musculoskeletal   Abdominal   Peds  Hematology   Anesthesia Other Findings   Reproductive/Obstetrics                              Anesthesia Physical Anesthesia Plan  ASA: 2  Anesthesia Plan: General   Post-op Pain Management:    Induction: Intravenous  PONV Risk Score and Plan: 2 and Ondansetron , Dexamethasone  and Treatment may vary due to age or medical condition  Airway Management Planned: LMA and Oral ETT  Additional Equipment:   Intra-op Plan:   Post-operative Plan: Extubation in OR  Informed Consent:      Dental advisory given  Plan Discussed with: CRNA  Anesthesia Plan Comments:          Anesthesia Quick Evaluation

## 2023-12-09 NOTE — Transfer of Care (Signed)
 Immediate Anesthesia Transfer of Care Note  Patient: Amanda Zuniga  Procedure(s) Performed: ARTHROSCOPY, KNEE, WITH MEDIAL MENISCECTOMY, CHONDROPLASTY (Right: Knee)  Patient Location: PACU  Anesthesia Type:General  Level of Consciousness: awake, alert , and oriented  Airway & Oxygen Therapy: Patient Spontanous Breathing  Post-op Assessment: Report given to RN and Post -op Vital signs reviewed and stable  Post vital signs: Reviewed and stable  Last Vitals:  Vitals Value Taken Time  BP 136/81 12/09/23 16:00  Temp 36.3 C 12/09/23 16:00  Pulse 86 12/09/23 16:02  Resp 12 12/09/23 16:02  SpO2 99 % 12/09/23 16:02  Vitals shown include unfiled device data.  Last Pain:  Vitals:   12/09/23 1600  TempSrc:   PainSc: 7          Complications: No notable events documented.

## 2023-12-09 NOTE — H&P (Signed)
 A pre op hand p   Chief Complaint: right knee pain  HPI: Amanda Zuniga is a 58 y.o. female who presents for evaluation of right knee pain. It has been present for greater than 3 months and has been worsening. She has failed conservative measures. Pain is rated as moderate.  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   CIN I (cervical intraepithelial neoplasia I)    Ectopic pregnancy    HISTORY OF ECTOPIC / METHOTREXATE   GERD (gastroesophageal reflux disease)    NSVD (normal spontaneous vaginal delivery)    macrosomia   Pre-diabetes    Past Surgical History:  Procedure Laterality Date   ABLATION ON ENDOMETRIOSIS N/A 07/01/2013   Procedure: Fulguration of ENDOMETRIOSIS;  Surgeon: Curlee VEAR Guan, MD;  Location: [facility];  Service: Gynecology;  Laterality: N/A;   BREAST BIOPSY Right 05/07/2022   MM RT BREAST BX W LOC DEV 1ST LESION IMAGE BX SPEC STEREO GUIDE 05/07/2022 [facility]   CERVICAL BIOPSY  W/ LOOP ELECTRODE EXCISION     CESAREAN SECTION  2004   33 weeks-hyprops, fetal tachycardia   COLONOSCOPY     2016   DILATION AND CURETTAGE OF UTERUS  1997   miscarriage   LAPAROSCOPY Bilateral 07/01/2013   Procedure: LAPAROSCOPY OPERATIVE/BILATERAL OVARIAN CYSTECTOMY;  Surgeon: Curlee VEAR Guan, MD;  Location: [facility];  Service: Gynecology;  Laterality: Bilateral;  bilateral ovarian cystectomy    Social History   Socioeconomic History   Marital status: Married    Spouse name: Not on file   Number of children: 2   Years of education: Not on file   Highest education level: Not on file  Occupational History   Occupation: Event Organiser: [employer]  Tobacco Use   Smoking status: Never   Smokeless tobacco: Never  Vaping Use   Vaping status: Never Used  Substance and Sexual Activity   Alcohol use: Yes    Comment: socially   Drug use: No   Sexual activity: Yes     Partners: Male    Birth control/protection: Post-menopausal, Other-see comments    Comment: HUSBAND  VASECTOMY  Other Topics Concern   Not on file  Social History Narrative   Not on file   Social Drivers of Health   Financial Resource Strain: Not on file  Food Insecurity: Not on file  Transportation Needs: Not on file  Physical Activity: Not on file  Stress: Not on file  Social Connections: Not on file   Family History  Problem Relation Age of Onset   Colon cancer Mother    Bladder Cancer Mother    Heart disease Mother        MI   Heart attack Mother    Prostate cancer Father    Bladder Cancer Maternal Grandmother    Diabetes Paternal Grandmother    Esophageal cancer Neg Hx    Rectal cancer Neg Hx    Stomach cancer Neg Hx    BRCA 1/2 Neg Hx    Breast cancer Neg Hx    No Known Allergies Prior to Admission medications   Medication Sig Start Date End Date Taking? Authorizing Provider  cetirizine (ZYRTEC) 10 MG tablet Take 10 mg by mouth daily.   Yes [provider]  estradiol  (VIVELLE -DOT) 0.0375 MG/24HR Place 1 patch onto the skin 2 (two)  times a week. 12/17/22  Yes Hines, Genesis V, MD  ezetimibe  (ZETIA ) 10 MG tablet Take 1 tablet (10 mg total) by mouth daily. Patient taking differently: Take 10 mg by mouth See admin instructions. Five time a week 08/26/23  Yes Mercer Clotilda SAUNDERS, MD  fluvastatin  XL (LESCOL  XL) 80 MG 24 hr tablet TAKE 1 TABLET BY MOUTH FIVE TIMES A WEEK 10/01/23  Yes Mercer Clotilda SAUNDERS, MD  mometasone (NASONEX) 50 MCG/ACT nasal spray Place 2 sprays into the nose daily.   Yes [provider]  progesterone  (PROMETRIUM ) 100 MG capsule Take 1 capsule (100 mg total) by mouth at bedtime. 12/17/22  Yes Hines, Genesis V, MD  Vitamin D , Ergocalciferol , (DRISDOL ) 1.25 MG (50000 UNIT) CAPS capsule Take 1 capsule (50,000 Units total) by mouth every 7 (seven) days. Patient not taking: Reported on 12/04/2023 08/23/23   Mercer Clotilda SAUNDERS, MD     Positive ROS:  None  All other systems have been reviewed and were otherwise negative with the exception of those mentioned in the HPI and as above.  Physical Exam: There were no vitals filed for this visit.  General: Alert, no acute distress Cardiovascular: No pedal edema Respiratory: No cyanosis, no use of accessory musculature GI: No organomegaly, abdomen is soft and non-tender Skin: No lesions in the area of chief complaint Neurologic: Sensation intact distally Psychiatric: Patient is competent for consent with normal mood and affect Lymphatic: No axillary or cervical lymphadenopathy  MUSCULOSKELETAL: Right knee: Tender to palpation over the medial joint line.  Pause McMurray.  No instability.  Trace effusion.  No erythema or warmth.  Assessment/Plan: RIGHT KNEE MEDIAL MENISCUS TEAR Plan for Procedure(s): ARTHROSCOPY, KNEE, WITH MEDIAL MENISCECTOMY  The risks benefits and alternatives were discussed with the patient including but not limited to the risks of nonoperative treatment, versus surgical intervention including infection, bleeding, nerve injury, malunion, nonunion, hardware prominence, hardware failure, need for hardware removal, blood clots, cardiopulmonary complications, morbidity, mortality, among others, and they were willing to proceed.  Predicted outcome is good, although there will be at least a six to nine month expected recovery.  [name], MD 12/09/2023 10:56 AM

## 2023-12-09 NOTE — Op Note (Signed)
 12/09/2023  4:41 PM  PATIENT:  Amanda Zuniga  58 y.o. female  PRE-OPERATIVE DIAGNOSIS:  RIGHT KNEE MEDIAL MENISCUS TEAR  POST-OPERATIVE DIAGNOSIS:  RIGHT KNEE MEDIAL MENISCUS TEAR and chondromalacia of the medial femoral condyle, medial tibial plateau, posterior surface of the patella, and patellofemoral trochlea.  PROCEDURE:  Procedure(s): ARTHROSCOPY, KNEE, WITH MEDIAL MENISCECTOMY, CHONDROPLASTY (Right) of the medial compartment and patellofemoral joint  SURGEON:  Surgeons and Role:    DEWAINE Yvone Rush, MD - Primary  PHYSICIAN ASSISTANT:   ASSISTANTS: Camellia Ellen, PA  ANESTHESIA:   general  EBL:  1 mL   BLOOD ADMINISTERED:none  DRAINS: none   LOCAL MEDICATIONS USED:  MARCAINE      SPECIMEN:  No Specimen  DISPOSITION OF SPECIMEN:  N/A  COUNTS:  YES  TOURNIQUET:  * No tourniquets in log *  Detail of procedure: Patient was taken operating room after adequate anesthesia obtained with a general psych patient patient brought operating table the right leg was then prepped and draped in usual sterile fashion following this routine arthroscopy examination of the right knee revealed that there was chondromalacia in the patellofemoral joint.  There was grade 2 changes on the posterior aspect of the patella and an area of 2 x 2 cm.  There was grade 2 and 3 changes on the anteromedial femoral condyle and area of 2 x 2 cm.  We gained access to the medial compartment after debridement of the plica.  There was an obvious tear of the posterior horn of the medial meniscus.  There was a flap posteriorly and there was displaceable meniscus anterior to the radial tear.  We carefully debrided the meniscus to a smooth and stable rim.  The posterior portion of the meniscus was completely removed and the remaining medial meniscus was trimmed fine down to a stable meniscus.  The medial compartment was then debrided with a suction shaver of its grade 2 and grade 3 change.  There was an area of 2  x 3 cm on the medial femoral condyle and 2 x 2 cm on the medial tibial plateau.  Attention was turned to the ACL which was normal.  Attention was turned to the lateral compartment which was normal as well.  A final check was made for loose and fragmenting pieces of meniscus and cartilage.  None were seen.  At this point the knee was copes irrigated and suctioned dry.  25 cc of 1/4% Marcaine  was instilled in the knee for postoperative anesthesia.  Sterile compressive dressing was applied patient taken recovery as noted to be in satisfactory condition.  PLAN OF CARE: Discharge to home after PACU  PATIENT DISPOSITION:  PACU - hemodynamically stable.   Delay start of Pharmacological VTE agent (>24hrs) due to surgical blood loss or risk of bleeding: no

## 2023-12-10 NOTE — Anesthesia Postprocedure Evaluation (Signed)
 Anesthesia Post Note  Patient: Charon Smedberg  Procedure(s) Performed: ARTHROSCOPY, KNEE, WITH MEDIAL MENISCECTOMY, CHONDROPLASTY (Right: Knee)     Patient location during evaluation: PACU Anesthesia Type: General Level of consciousness: awake Pain management: pain level controlled Vital Signs Assessment: post-procedure vital signs reviewed and stable Respiratory status: spontaneous breathing Cardiovascular status: blood pressure returned to baseline Postop Assessment: no apparent nausea or vomiting Anesthetic complications: no   No notable events documented.  Last Vitals:  Vitals:   12/09/23 1700 12/09/23 1715  BP: 117/64 125/64  Pulse: 69 (!) 56  Resp: 15 16  Temp:  (!) 36.3 C  SpO2: 94% 98%    Last Pain:  Vitals:   12/09/23 1715  TempSrc:   PainSc: 4                  Lauraine KATHEE [place]

## 2023-12-11 ENCOUNTER — Encounter (HOSPITAL_COMMUNITY): Payer: Self-pay | Admitting: Orthopedic Surgery

## 2023-12-18 ENCOUNTER — Ambulatory Visit: Admitting: Obstetrics and Gynecology

## 2023-12-23 ENCOUNTER — Other Ambulatory Visit: Payer: Self-pay | Admitting: Family Medicine

## 2023-12-23 DIAGNOSIS — E782 Mixed hyperlipidemia: Secondary | ICD-10-CM

## 2023-12-24 DIAGNOSIS — M6281 Muscle weakness (generalized): Secondary | ICD-10-CM | POA: Diagnosis not present

## 2023-12-24 DIAGNOSIS — M25661 Stiffness of right knee, not elsewhere classified: Secondary | ICD-10-CM | POA: Diagnosis not present

## 2024-01-01 ENCOUNTER — Encounter: Payer: Self-pay | Admitting: Obstetrics and Gynecology

## 2024-01-01 ENCOUNTER — Ambulatory Visit (INDEPENDENT_AMBULATORY_CARE_PROVIDER_SITE_OTHER): Admitting: Obstetrics and Gynecology

## 2024-01-01 VITALS — BP 124/64 | HR 72 | Temp 97.9°F | Ht 66.25 in | Wt 165.0 lb

## 2024-01-01 DIAGNOSIS — Z01419 Encounter for gynecological examination (general) (routine) without abnormal findings: Secondary | ICD-10-CM

## 2024-01-01 DIAGNOSIS — Z1331 Encounter for screening for depression: Secondary | ICD-10-CM | POA: Diagnosis not present

## 2024-01-01 DIAGNOSIS — N951 Menopausal and female climacteric states: Secondary | ICD-10-CM

## 2024-01-01 DIAGNOSIS — M6281 Muscle weakness (generalized): Secondary | ICD-10-CM | POA: Diagnosis not present

## 2024-01-01 DIAGNOSIS — M25661 Stiffness of right knee, not elsewhere classified: Secondary | ICD-10-CM | POA: Diagnosis not present

## 2024-01-01 MED ORDER — ESTRADIOL 0.0375 MG/24HR TD PTTW: 1.0000 | MEDICATED_PATCH | TRANSDERMAL | 4 refills | Status: AC

## 2024-01-01 MED ORDER — PROGESTERONE MICRONIZED 100 MG PO CAPS: 100.0000 mg | ORAL_CAPSULE | Freq: Every day | ORAL | 4 refills | Status: AC

## 2024-01-01 NOTE — Patient Instructions (Signed)
 For patients under 50-58yo, I recommend 1200mg  calcium  daily and 600IU of vitamin D daily. For patients over 58yo, I recommend 1200mg  calcium  daily and 800IU of vitamin D daily.  Health Maintenance, Female Adopting a healthy lifestyle and getting preventive care are important in promoting health and wellness. Ask your health care provider about: The right schedule for you to have regular tests and exams. Things you can do on your own to prevent diseases and keep yourself healthy. What should I know about diet, weight, and exercise? Eat a healthy diet  Eat a diet that includes plenty of vegetables, fruits, low-fat dairy products, and lean protein. Do not eat a lot of foods that are high in solid fats, added sugars, or sodium. Maintain a healthy weight Body mass index (BMI) is used to identify weight problems. It estimates body fat based on height and weight. Your health care provider can help determine your BMI and help you achieve or maintain a healthy weight. Get regular exercise Get regular exercise. This is one of the most important things you can do for your health. Most adults should: Exercise for at least 150 minutes each week. The exercise should increase your heart rate and make you sweat (moderate-intensity exercise). Do strengthening exercises at least twice a week. This is in addition to the moderate-intensity exercise. Spend less time sitting. Even light physical activity can be beneficial. Watch cholesterol and blood lipids Have your blood tested for lipids and cholesterol at 58 years of age, then have this test every 5 years. Have your cholesterol levels checked more often if: Your lipid or cholesterol levels are high. You are older than 58 years of age. You are at high risk for heart disease. What should I know about cancer screening? Depending on your health history and family history, you may need to have cancer screening at various ages. This may include screening  for: Breast cancer. Cervical cancer. Colorectal cancer. Skin cancer. Lung cancer. What should I know about heart disease, diabetes, and high blood pressure? Blood pressure and heart disease High blood pressure causes heart disease and increases the risk of stroke. This is more likely to develop in people who have high blood pressure readings or are overweight. Have your blood pressure checked: Every 3-5 years if you are 25-57 years of age. Every year if you are 24 years old or older. Diabetes Have regular diabetes screenings. This checks your fasting blood sugar level. Have the screening done: Once every three years after age 62 if you are at a normal weight and have a low risk for diabetes. More often and at a younger age if you are overweight or have a high risk for diabetes. What should I know about preventing infection? Hepatitis B If you have a higher risk for hepatitis B, you should be screened for this virus. Talk with your health care provider to find out if you are at risk for hepatitis B infection. Hepatitis C Testing is recommended for: Everyone born from [birth year] through 1965. Anyone with known risk factors for hepatitis C. Sexually transmitted infections (STIs) Get screened for STIs, including gonorrhea and chlamydia, if: You are sexually active and are younger than 58 years of age. You are older than 58 years of age and your health care provider tells you that you are at risk for this type of infection. Your sexual activity has changed since you were last screened, and you are at increased risk for chlamydia or gonorrhea. Ask your health care provider if  you are at risk. Ask your health care provider about whether you are at high risk for HIV. Your health care provider may recommend a prescription medicine to help prevent HIV infection. If you choose to take medicine to prevent HIV, you should first get tested for HIV. You should then be tested every 3 months for as long as you  are taking the medicine. Osteoporosis and menopause Osteoporosis is a disease in which the bones lose minerals and strength with aging. This can result in bone fractures. If you are 72 years old or older, or if you are at risk for osteoporosis and fractures, ask your health care provider if you should: Be screened for bone loss. Take a calcium  or vitamin D supplement to lower your risk of fractures. Be given hormone replacement therapy (HRT) to treat symptoms of menopause. Follow these instructions at home: Alcohol use Do not drink alcohol if: Your health care provider tells you not to drink. You are pregnant, may be pregnant, or are planning to become pregnant. If you drink alcohol: Limit how much you have to: 0-1 drink a day. Know how much alcohol is in your drink. In the [place] one drink equals one 12 oz bottle of beer (355 mL), one 5 oz glass of wine (148 mL), or one 1 oz glass of hard liquor (44 mL). Lifestyle Do not use any products that contain nicotine or tobacco. These products include cigarettes, chewing tobacco, and vaping devices, such as e-cigarettes. If you need help quitting, ask your health care provider. Do not use street drugs. Do not share needles. Ask your health care provider for help if you need support or information about quitting drugs. General instructions Schedule regular health, dental, and eye exams. Stay current with your vaccines. Tell your health care provider if: You often feel depressed. You have ever been abused or do not feel safe at home. Summary Adopting a healthy lifestyle and getting preventive care are important in promoting health and wellness. Follow your health care provider's instructions about healthy diet, exercising, and getting tested or screened for diseases. Follow your health care provider's instructions on monitoring your cholesterol and blood pressure. This information is not intended to replace advice given to you by your health  care provider. Make sure you discuss any questions you have with your health care provider. Document Revised: 06/06/2020 Document Reviewed: 06/06/2020 Elsevier Patient Education  2024 [employer].

## 2024-01-01 NOTE — Assessment & Plan Note (Addendum)
 Using for VMS and mood sx. Will continue current dosing given weight concerns. Plan to taper down next year, patient in agreement

## 2024-01-01 NOTE — Assessment & Plan Note (Signed)
 Cervical cancer screening performed according to ASCCP guidelines. Encouraged annual mammogram screening Colonoscopy UTD DXA N/A Labs and immunizations with her primary Encouraged safe sexual practices as indicated Encouraged healthy lifestyle practices with diet and exercise For patients under 50-58yo, I recommend 1200mg  calcium daily and 600IU of vitamin D daily.

## 2024-01-01 NOTE — Progress Notes (Signed)
 58 y.o. [id number] postmenopausal female on HRT, hx of endometriosis here for annual exam.  Married.  Vasectomy.  PCP: Mercer Clotilda SAUNDERS, MD  Pt would like to discuss recommendations for continuing HRT, discuss weight gain and possible GLP1. Currently at heaviest weight. Has not been able to exercise since August due to right meniscus tear. 3lb weight gain since last annual exam. Doing well on HRT, no VMS. Has been on HRT since 2016, same dose since 2019.  Postmenopausal bleeding: none Pelvic discharge or pain: none Breast mass, nipple discharge or skin changes : none  Sexually active: Yes.    Exercising: not currently due to knee surgery Smoker:  no   H/o abnl PAP; yes, CIN1, prior LEEP 1993 Last PAP:     Component Value Date/Time   DIAGPAP  12/14/2021 1629    - Negative for Intraepithelial Lesions or Malignancy (NILM)   DIAGPAP - Benign reactive/reparative changes 12/14/2021 1629   DIAGPAP (A) 12/08/2020 1506    - Atypical squamous cells of undetermined significance (ASC-US )   HPVHIGH Negative 12/08/2020 1506   ADEQPAP  12/14/2021 1629    Satisfactory for evaluation; transformation zone component PRESENT.   ADEQPAP  12/08/2020 1506    Satisfactory for evaluation; transformation zone component PRESENT.   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, fulguration of endometriosis 1993 CIN 1  OB History  Gravida Para Term Preterm AB Living  4 2 1 1 2 2   SAB IAB Ectopic Multiple Live Births  1  1  2     # Outcome Date GA Lbr Len/2nd Weight Sex Type Anes PTL Lv  4 Ectopic           3 SAB           2 Preterm      M CS-Unspec  Y LIV  1 Term     M Vag-Spont  N LIV    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   CIN I (cervical intraepithelial neoplasia I)    Ectopic pregnancy    HISTORY OF ECTOPIC / METHOTREXATE   GERD (gastroesophageal reflux disease)    NSVD (normal spontaneous vaginal delivery)    macrosomia   Pre-diabetes     Past Surgical History:  Procedure Laterality Date   ABLATION ON ENDOMETRIOSIS N/A 07/01/2013   Procedure: Fulguration of ENDOMETRIOSIS;  Surgeon: Curlee VEAR Guan, MD;  Location: [facility];  Service: Gynecology;  Laterality: N/A;   BREAST BIOPSY Right 05/07/2022   MM RT BREAST BX W LOC DEV 1ST LESION IMAGE BX SPEC STEREO GUIDE 05/07/2022 [facility]   CERVICAL BIOPSY  W/ LOOP ELECTRODE EXCISION     CESAREAN SECTION  2004   33 weeks-hyprops, fetal tachycardia   COLONOSCOPY     2016   DILATION AND CURETTAGE OF UTERUS  1997   miscarriage   KNEE ARTHROSCOPY WITH MEDIAL MENISECTOMY Right 12/09/2023   Procedure: ARTHROSCOPY, KNEE, WITH MEDIAL MENISCECTOMY, CHONDROPLASTY;  Surgeon: Yvone Rush, MD;  Location: [facility];  Service: Orthopedics;  Laterality: Right;   LAPAROSCOPY Bilateral 07/01/2013   Procedure: LAPAROSCOPY OPERATIVE/BILATERAL OVARIAN CYSTECTOMY;  Surgeon: Curlee VEAR Guan, MD;  Location: [facility];  Service: Gynecology;  Laterality: Bilateral;  bilateral ovarian cystectomy     Current Outpatient Medications on File Prior to Visit  Medication Sig Dispense Refill   cetirizine (ZYRTEC) 10 MG tablet Take 10 mg by mouth daily.     ezetimibe  (ZETIA ) 10 MG tablet Take 1 tablet (10 mg total) by mouth daily. 90 tablet 3   fluvastatin  XL (LESCOL  XL) 80 MG 24 hr tablet TAKE 1 TABLET BY MOUTH FIVE TIMES A WEEK 60 tablet 1   mometasone (NASONEX) 50 MCG/ACT nasal spray Place 2 sprays into the nose daily.     No current facility-administered medications on file prior to  visit.    Social History   Socioeconomic History   Marital status: Married    Spouse name: Not on file   Number of children: 2   Years of education: Not on file   Highest education level: Not on file  Occupational History   Occupation: Event Organiser: [employer]  Tobacco Use   Smoking status: Never   Smokeless tobacco: Never  Vaping Use   Vaping status: Never Used  Substance and Sexual Activity   Alcohol use: Yes    Comment: socially   Drug use: No   Sexual activity: Yes    Partners: Male    Birth control/protection: Post-menopausal, Other-see comments    Comment: HUSBAND  VASECTOMY  Other Topics Concern   Not on file  Social History Narrative   Not on file   Social Drivers of Health   Financial Resource Strain: Not on file  Food Insecurity: Not on file  Transportation Needs: Not on file  Physical Activity: Not on file  Stress: Not on file  Social Connections: Not on file  Intimate Partner Violence: Not on file    Family History  Problem Relation Age of Onset   Colon cancer Mother    Bladder Cancer Mother    Heart disease Mother        MI   Heart attack Mother    Prostate cancer Father    Bladder Cancer Maternal Grandmother    Diabetes Paternal Grandmother    Esophageal cancer Neg Hx    Rectal cancer Neg Hx    Stomach cancer Neg Hx    BRCA 1/2 Neg Hx    Breast cancer Neg Hx     No Known Allergies    PE Today's Vitals   01/01/24 0930  BP: 124/64  Pulse: 72  Temp: 97.9 F (36.6 C)  TempSrc: Oral  SpO2: 99%  Weight: 165 lb (74.8 kg)  Height: 5' 6.25 (1.683 m)   Body mass index is 26.43 kg/m.  Physical Exam Vitals reviewed. Exam conducted with a chaperone present.  Constitutional:      General: She is not in acute distress.    Appearance: Normal appearance.  HENT:     Head: Normocephalic and atraumatic.     Nose: Nose normal.  Eyes:     Extraocular Movements: Extraocular movements intact.     Conjunctiva/sclera: Conjunctivae  normal.  Pulmonary:     Effort: Pulmonary effort is normal.  Chest:     Chest wall: No mass or tenderness.  Breasts:    Right: Normal. No swelling, mass, nipple discharge, skin change or tenderness.     Left: Normal. No swelling, mass,  nipple discharge, skin change or tenderness.  Abdominal:     General: There is no distension.     Palpations: Abdomen is soft.     Tenderness: There is no abdominal tenderness.  Genitourinary:    General: Normal vulva.     Exam position: Lithotomy position.     Urethra: No prolapse.     Vagina: Normal. No vaginal discharge or bleeding.     Cervix: Normal. No lesion.     Uterus: Normal. Not enlarged and not tender.      Adnexa: Right adnexa normal and left adnexa normal.  Musculoskeletal:        General: Normal range of motion.     Cervical back: Normal range of motion.  Lymphadenopathy:     Upper Body:     Right upper body: No axillary adenopathy.     Left upper body: No axillary adenopathy.     Lower Body: No right inguinal adenopathy. No left inguinal adenopathy.  Skin:    General: Skin is warm and dry.  Neurological:     General: No focal deficit present.     Mental Status: She is alert.  Psychiatric:        Mood and Affect: Mood normal.        Behavior: Behavior normal.      Assessment and Plan:        Well woman exam with routine gynecological exam Assessment & Plan: Cervical cancer screening performed according to ASCCP guidelines. Encouraged annual mammogram screening Colonoscopy UTD DXA N/A Labs and immunizations with her primary Encouraged safe sexual practices as indicated Encouraged healthy lifestyle practices with diet and exercise For patients under 50-70yo, I recommend 1200mg  calcium daily and 600IU of vitamin D  daily.    Vasomotor symptoms due to menopause Assessment & Plan: Using for VMS and mood sx. Will continue current dosing given weight concerns. Plan to taper down next year, patient in agreement  Orders: -      Estradiol ; Place 1 patch onto the skin 2 (two) times a week.  Dispense: 24 patch; Refill: 4 -     Progesterone ; Take 1 capsule (100 mg total) by mouth at bedtime.  Dispense: 90 capsule; Refill: 4  Negative depression screening   Will send message to DOROTHA Bland, NP about evaluation for wegovy.  Vera LULLA Pa, MD

## 2024-01-08 DIAGNOSIS — M25661 Stiffness of right knee, not elsewhere classified: Secondary | ICD-10-CM | POA: Diagnosis not present

## 2024-01-08 DIAGNOSIS — M6281 Muscle weakness (generalized): Secondary | ICD-10-CM | POA: Diagnosis not present

## 2024-01-15 DIAGNOSIS — M6281 Muscle weakness (generalized): Secondary | ICD-10-CM | POA: Diagnosis not present

## 2024-01-15 DIAGNOSIS — M25661 Stiffness of right knee, not elsewhere classified: Secondary | ICD-10-CM | POA: Diagnosis not present

## 2024-02-10 ENCOUNTER — Telehealth: Payer: Self-pay

## 2024-02-10 NOTE — Telephone Encounter (Signed)
 Attempted to reach patient concerning colonoscopy recall; unable to speak with patient;  left message and number to the office for patient to call back and schedule appts;
